# Patient Record
Sex: Male | Born: 1980 | Race: White | Hispanic: No | Marital: Married | Smoking: Former smoker
Health system: Southern US, Community
[De-identification: ages and names within clinical notes are randomized; demographics above are authoritative.]

## PROBLEM LIST (undated history)

## (undated) DIAGNOSIS — E785 Hyperlipidemia, unspecified: Secondary | ICD-10-CM

## (undated) DIAGNOSIS — I82409 Acute embolism and thrombosis of unspecified deep veins of unspecified lower extremity: Secondary | ICD-10-CM

## (undated) DIAGNOSIS — Z95818 Presence of other cardiac implants and grafts: Principal | ICD-10-CM

## (undated) DIAGNOSIS — I499 Cardiac arrhythmia, unspecified: Secondary | ICD-10-CM

## (undated) HISTORY — DX: Cardiac arrhythmia, unspecified: I49.9

## (undated) HISTORY — DX: Hyperlipidemia, unspecified: E78.5

## (undated) HISTORY — PX: LOOP RECORDER REMOVAL: EP1215

## (undated) HISTORY — PX: KNEE SURGERY: SHX244

## (undated) HISTORY — DX: Presence of other cardiac implants and grafts: Z95.818

## (undated) HISTORY — DX: Acute embolism and thrombosis of unspecified deep veins of unspecified lower extremity: I82.409

---

## 2010-05-12 HISTORY — PX: OTHER SURGICAL HISTORY: SHX169

## 2012-05-12 DIAGNOSIS — Z95818 Presence of other cardiac implants and grafts: Secondary | ICD-10-CM

## 2012-05-12 HISTORY — PX: OTHER SURGICAL HISTORY: SHX169

## 2012-05-12 HISTORY — DX: Presence of other cardiac implants and grafts: Z95.818

## 2016-06-13 ENCOUNTER — Encounter (INDEPENDENT_AMBULATORY_CARE_PROVIDER_SITE_OTHER): Payer: Self-pay

## 2016-06-13 ENCOUNTER — Ambulatory Visit (INDEPENDENT_AMBULATORY_CARE_PROVIDER_SITE_OTHER): Payer: Medicaid Other | Admitting: Family Medicine

## 2016-06-13 ENCOUNTER — Encounter: Payer: Self-pay | Admitting: Family Medicine

## 2016-06-13 VITALS — BP 103/74 | HR 72 | Temp 97.0°F | Ht 72.0 in | Wt 214.0 lb

## 2016-06-13 DIAGNOSIS — Z131 Encounter for screening for diabetes mellitus: Secondary | ICD-10-CM

## 2016-06-13 DIAGNOSIS — H9312 Tinnitus, left ear: Secondary | ICD-10-CM | POA: Diagnosis not present

## 2016-06-13 DIAGNOSIS — Z13220 Encounter for screening for lipoid disorders: Secondary | ICD-10-CM

## 2016-06-13 MED ORDER — FLUTICASONE PROPIONATE 50 MCG/ACT NA SUSP: 1.0000 | Freq: Two times a day (BID) | NASAL | 6 refills | Status: DC | PRN

## 2016-06-13 NOTE — Progress Notes (Signed)
BP 103/74   Pulse 72   Temp 97 F (36.1 C) (Oral)   Ht 6' (1.829 m)   Wt 214 lb (97.1 kg)   BMI 29.02 kg/m    Subjective:    Patient ID: Ryan Conner, male    DOB: Oct 21, 1980, 36 y.o.   MRN: [id number]  HPI: Ryan Conner is a 36 y.o. male presenting on 06/13/2016 for Tinnitus (Left ear)   HPI Left ear ringing Patient is coming in with complaints of left ear ringing is been going on for the past couple days. He does say that he had a viral, flulike illness over the past few days and had high fevers and cough and congestion and is still having some sinus pressure around his nose. The rain that he is having his left ear is been driving her crazy and he wants to know if there is anything that can be done about it. He denies any hearing loss that he knows of. He has not had this issue before. He denies any nausea or vomiting or dizziness.  Relevant past medical, surgical, family and social history reviewed and updated as indicated. Interim medical history since our last visit reviewed. Allergies and medications reviewed and updated.  Review of Systems  Constitutional: Negative for chills and fever.  HENT: Positive for congestion, sinus pressure and tinnitus. Negative for ear discharge, ear pain, postnasal drip, rhinorrhea, sneezing, sore throat and voice change.   Eyes: Negative for pain, discharge, redness and visual disturbance.  Respiratory: Negative for cough, shortness of breath and wheezing.   Cardiovascular: Negative for chest pain and leg swelling.  Musculoskeletal: Negative for gait problem.  Skin: Negative for rash.  Neurological: Negative for dizziness and light-headedness.  All other systems reviewed and are negative.   Per HPI unless specifically indicated above  Social History   Social History  . Marital status: Married    Spouse name: N/A  . Number of children: N/A  . Years of education: N/A   Occupational History  . Not on file.   Social History Main  Topics  . Smoking status: Current Every Day Smoker    Packs/day: 0.50    Years: 20.00  . Smokeless tobacco: Never Used  . Alcohol use Yes     Comment: occasional  . Drug use: No  . Sexual activity: Yes    Birth control/ protection: Surgical   Other Topics Concern  . Not on file   Social History Narrative  . No narrative on file    Past Surgical History:  Procedure Laterality Date  . CARDIAC SURGERY  2014   Monitor placed in heart due to arrythmias    Family History  Problem Relation Age of Onset  . Hypertension Mother   . Varicose Veins Mother   . Meniere's disease Mother   . Alcoholism Paternal Grandfather     Allergies as of 06/13/2016   No Known Allergies     Medication List       Accurate as of 06/13/16 11:22 AM. Always use your most recent med list.          fluticasone 50 MCG/ACT nasal spray Commonly known as:  FLONASE Place 1 spray into both nostrils 2 (two) times daily as needed for allergies or rhinitis.          Objective:    BP 103/74   Pulse 72   Temp 97 F (36.1 C) (Oral)   Ht 6' (1.829 m)   Wt 214 lb (97.1  kg)   BMI 29.02 kg/m   Wt Readings from Last 3 Encounters:  06/13/16 214 lb (97.1 kg)    Physical Exam  Constitutional: He is oriented to person, place, and time. He appears well-developed and well-nourished. No distress.  HENT:  Right Ear: Tympanic membrane, external ear and ear canal normal. Tympanic membrane is not perforated, not erythematous, not retracted and not bulging.  Left Ear: Tympanic membrane, external ear and ear canal normal. Tympanic membrane is not perforated, not erythematous, not retracted and not bulging.  Nose: No mucosal edema, rhinorrhea or sinus tenderness. No epistaxis. Right sinus exhibits no maxillary sinus tenderness and no frontal sinus tenderness. Left sinus exhibits no maxillary sinus tenderness and no frontal sinus tenderness.  Mouth/Throat: Uvula is midline and mucous membranes are normal. No  oropharyngeal exudate, posterior oropharyngeal edema, posterior oropharyngeal erythema or tonsillar abscesses.  Eyes: Conjunctivae and EOM are normal. Pupils are equal, round, and reactive to light. Right eye exhibits no discharge. Left eye exhibits no discharge. No scleral icterus.  Neck: Neck supple. No thyromegaly present.  Cardiovascular: Normal rate, regular rhythm, normal heart sounds and intact distal pulses.   No murmur heard. Pulmonary/Chest: Effort normal and breath sounds normal. No respiratory distress. He has no wheezes. He has no rales.  Musculoskeletal: Normal range of motion. He exhibits no edema.  Lymphadenopathy:    He has no cervical adenopathy.  Neurological: He is alert and oriented to person, place, and time. Coordination normal.  Skin: Skin is warm and dry. No rash noted. He is not diaphoretic.  Psychiatric: He has a normal mood and affect. His behavior is normal.  Nursing note and vitals reviewed.   No results found for this or any previous visit.    Assessment & Plan:   Problem List Items Addressed This Visit    None    Visit Diagnoses    Tinnitus aurium, left    -  Primary   Had a recent viral illness, recommend to use Flonase and Mucinex and nasal saline, return if does not improve or worsens   Diabetes mellitus screening       Relevant Orders   CMP14+EGFR (Completed)   Lipid screening       Relevant Orders   Lipid panel (Completed)       Follow up plan: Return if symptoms worsen or fail to improve, for Return for mole removal.  Caryl Pina, MD [facility] 06/13/2016, 11:22 AM

## 2016-06-14 LAB — CMP14+EGFR
A/G RATIO: 1.4 (ref 1.2–2.2)
ALBUMIN: 4.4 g/dL (ref 3.5–5.5)
ALT: 37 IU/L (ref 0–44)
AST: 23 IU/L (ref 0–40)
Alkaline Phosphatase: 118 IU/L — ABNORMAL HIGH (ref 39–117)
BUN / CREAT RATIO: 9 (ref 9–20)
BUN: 11 mg/dL (ref 6–20)
Bilirubin Total: 0.2 mg/dL (ref 0.0–1.2)
CALCIUM: 9.5 mg/dL (ref 8.7–10.2)
CO2: 25 mmol/L (ref 18–29)
Chloride: 98 mmol/L (ref 96–106)
Creatinine, Ser: 1.22 mg/dL (ref 0.76–1.27)
GFR, EST AFRICAN AMERICAN: 88 mL/min/{1.73_m2} (ref >59)
GFR, EST NON AFRICAN AMERICAN: 76 mL/min/{1.73_m2} (ref >59)
GLOBULIN, TOTAL: 3.2 g/dL (ref 1.5–4.5)
Glucose: 97 mg/dL (ref 65–99)
POTASSIUM: 4.6 mmol/L (ref 3.5–5.2)
SODIUM: 137 mmol/L (ref 134–144)
TOTAL PROTEIN: 7.6 g/dL (ref 6.0–8.5)

## 2016-06-14 LAB — LIPID PANEL
CHOL/HDL RATIO: 6.6 ratio — AB (ref 0.0–5.0)
Cholesterol, Total: 231 mg/dL — ABNORMAL HIGH (ref 100–199)
HDL: 35 mg/dL — ABNORMAL LOW (ref >39)
LDL Calculated: 124 mg/dL — ABNORMAL HIGH (ref 0–99)
Triglycerides: 359 mg/dL — ABNORMAL HIGH (ref 0–149)
VLDL Cholesterol Cal: 72 mg/dL — ABNORMAL HIGH (ref 5–40)

## 2016-06-25 ENCOUNTER — Telehealth: Payer: Self-pay | Admitting: Family Medicine

## 2016-06-26 NOTE — Telephone Encounter (Signed)
Pt notified of results Verbalizes understanding 

## 2016-07-01 ENCOUNTER — Other Ambulatory Visit: Payer: Self-pay

## 2016-07-01 MED ORDER — ATORVASTATIN CALCIUM 20 MG PO TABS: 20.0000 mg | ORAL_TABLET | Freq: Every day | ORAL | 0 refills | Status: DC

## 2016-07-07 ENCOUNTER — Encounter: Payer: Self-pay | Admitting: Family Medicine

## 2016-07-07 ENCOUNTER — Ambulatory Visit (INDEPENDENT_AMBULATORY_CARE_PROVIDER_SITE_OTHER): Payer: Medicaid Other | Admitting: Family Medicine

## 2016-07-07 VITALS — BP 116/75 | HR 69 | Temp 98.8°F | Ht 72.0 in | Wt 217.1 lb

## 2016-07-07 DIAGNOSIS — D229 Melanocytic nevi, unspecified: Secondary | ICD-10-CM

## 2016-07-07 DIAGNOSIS — I499 Cardiac arrhythmia, unspecified: Secondary | ICD-10-CM

## 2016-07-07 NOTE — Progress Notes (Signed)
BP 116/75   Pulse 69   Temp 98.8 F (37.1 C) (Oral)   Ht 6' (1.829 m)   Wt 217 lb 2 oz (98.5 kg)   BMI 29.45 kg/m    Subjective:    Patient ID: Ryan Conner, male    DOB: 23-Jun-1980, 36 y.o.   MRN: [id number]  HPI: Ryan Conner is a 36 y.o. male presenting on 07/07/2016 for Mole removal   HPI Large nevi that are increasing in size Patient has multiple large nevi that are increasing in size and somewhat changed in color. He has 2 very large and concerning on his back and one on his forehead and one on his anterior neck. There also in locations like his anterior neck constantly shaving and nicks it and It bleeds. He has never been diagnosed with any kind of skin cancers but with concerning about some of these because he gets so many large moles on his body.  Relevant past medical, surgical, family and social history reviewed and updated as indicated. Interim medical history since our last visit reviewed. Allergies and medications reviewed and updated.  Review of Systems  Constitutional: Negative for chills and fever.  Respiratory: Negative for shortness of breath and wheezing.   Cardiovascular: Negative for chest pain and leg swelling.  Musculoskeletal: Negative for back pain and gait problem.  Skin: Negative for color change and rash.  All other systems reviewed and are negative.   Per HPI unless specifically indicated above   Allergies as of 07/07/2016   No Known Allergies     Medication List       Accurate as of 07/07/16  2:11 PM. Always use your most recent med list.          atorvastatin 20 MG tablet Commonly known as:  LIPITOR Take 1 tablet (20 mg total) by mouth daily.   fluticasone 50 MCG/ACT nasal spray Commonly known as:  FLONASE Place 1 spray into both nostrils 2 (two) times daily as needed for allergies or rhinitis.          Objective:    BP 116/75   Pulse 69   Temp 98.8 F (37.1 C) (Oral)   Ht 6' (1.829 m)   Wt 217 lb 2 oz (98.5 kg)    BMI 29.45 kg/m   Wt Readings from Last 3 Encounters:  07/07/16 217 lb 2 oz (98.5 kg)  06/13/16 214 lb (97.1 kg)    Physical Exam  Constitutional: He is oriented to person, place, and time. He appears well-developed and well-nourished. No distress.  Eyes: Conjunctivae are normal. No scleral icterus.  Musculoskeletal: Normal range of motion. He exhibits no edema.  Neurological: He is alert and oriented to person, place, and time. Coordination normal.  Skin: Skin is warm and dry. Lesion (For large moles, one on his forehead that is half centimeters size and flesh-colored on the right side near his hairline. One on his anterior neck overlying his Adam's apple that is 0.25 cm in size and darkened pigmented and raised and pedunculated.) noted. No rash noted. He is not diaphoretic.  2 other nevi on his back in the thoracic region one just to left of his vertebrae in one just to the right of it that are half a centimeter in size and pedunculated and pigmented.  Psychiatric: He has a normal mood and affect. His behavior is normal.  Nursing note and vitals reviewed.   Skin lesion removal4: Betadine was used for preparation, 8 mL of 2% lidocaine  without epinephrine was used for anesthesia. Shave biopsy was performed of all 4 lesions, one on for head 1 on anterior neck, 2 on the back in the mid region. Electrocauterization was used on the neck and the forehead and silver nitrate was used on the back for hemostasis. Hemostasis was achieved and bleeding was minimal and patient tolerated procedure well     Assessment & Plan:   Problem List Items Addressed This Visit    None    Visit Diagnoses    Multiple atypical nevi    -  Primary   Relevant Orders   Pathology   Pathology   Pathology   Pathology   Cardiac arrhythmia, unspecified cardiac arrhythmia type       needs removal of internal monitor after ablation   Relevant Orders   Ambulatory referral to Cardiology     Patient has a history of a  cardiac ablation for an arrhythmia and has not had any issues long-term, this was at a previous facility and he does not have a cardiologist here and he would like to have the monitor removed because it is nonfunctional at this point.   Follow up plan: Return if symptoms worsen or fail to improve.  Counseling provided for all of the vaccine components Orders Placed This Encounter  Procedures  . Ambulatory referral to Cardiology    Caryl Pina, MD [facility] 07/07/2016, 2:11 PM

## 2016-07-10 LAB — PATHOLOGY

## 2016-07-31 ENCOUNTER — Encounter: Payer: Self-pay | Admitting: Cardiology

## 2016-07-31 NOTE — Progress Notes (Signed)
Cardiology Office Note  Date: 08/04/2016   ID: Ryan Conner, DOB 29-Mar-1981, MRN [id number]  PCP: Worthy Rancher, MD  Consulting Cardiologist: Rozann Lesches, MD   Chief Complaint  Patient presents with  . History of SVT  . Loop recorder in place    History of Present Illness: Ryan Conner is a 36 y.o. male referred for cardiology consultation by Dr. Warrick Parisian. Very limited information is available. We are requesting outside records that may have been received by [facility]. Patient describes what sounds like a history of symptomatic SVT associated with syncope back in 2014. He underwent radiofrequency ablation in [place] and also had an implantable loop recorder placed for further monitoring. He brought in device cards today. He has a Medtronic [employer], implanted 12/03/2012, serial number [id number] model number [id number]. He has not attempted a remote interrogation in quite some time and likely the device is no longer functioning.  He does not report any symptoms of significant palpitations or syncope, no exertional chest pain or unusual shortness of breath. He does describe a vague intermittent sharp soreness around where the device was implanted. He would like to have it removed.  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  Past Medical History:  Diagnosis Date  . Hyperlipidemia   . Status post placement of implantable loop recorder 2014   Medtronic Reveal LINQ - implanted in [place]    Past Surgical History:  Procedure Laterality Date  . Implantable loop recorder  2014    Current Outpatient Prescriptions  Medication Sig Dispense Refill  . atorvastatin (LIPITOR) 20 MG tablet Take 1 tablet (20 mg total) by mouth daily. 90 tablet 0   No current facility-administered medications for this visit.    Allergies:  Patient has no known allergies.   Social History: The patient  reports that he has been smoking.  He  started smoking about 24 years ago. He has a 10.00 pack-year smoking history. He has never used smokeless tobacco. He reports that he drinks alcohol. He reports that he does not use drugs.   Family History: The patient's family history includes Alcoholism in his paternal grandfather; Hypertension in his mother; Meniere's disease in his mother; Varicose Veins in his mother.   ROS:  Please see the history of present illness. Otherwise, complete review of systems is positive for none.  All other systems are reviewed and negative.   Physical Exam: VS:  BP 124/86   Pulse 77   Wt 219 lb (99.3 kg)   SpO2 97%   BMI 29.70 kg/m , BMI Body mass index is 29.7 kg/m.  Wt Readings from Last 3 Encounters:  08/04/16 219 lb (99.3 kg)  07/07/16 217 lb 2 oz (98.5 kg)  06/13/16 214 lb (97.1 kg)    General: Patient appears comfortable at rest. HEENT: Conjunctiva and lids normal, oropharynx clear with moist mucosa. Neck: Supple, no elevated JVP or carotid bruits, no thyromegaly. Lungs: Clear to auscultation, nonlabored breathing at rest. Cardiac: Regular rate and rhythm, no S3 or significant systolic murmur, no pericardial rub. Thorax: Palpable small area of firmness anteriorly to the left of the sternum consistent with reported implantable loop recorder. Abdomen: Soft, nontender, bowel sounds present. Extremities: No pitting edema, distal pulses 2+. Skin: Warm and dry. Musculoskeletal: No kyphosis. Neuropsychiatric: Alert and oriented x3, affect grossly appropriate.  ECG: No old tracing available for comparison.  Recent Labwork: 06/13/2016: ALT 37; AST 23; BUN 11; Creatinine, Ser  1.22; Potassium 4.6; Sodium 137     Component Value Date/Time   CHOL 231 (H) 06/13/2016 1124   TRIG 359 (H) 06/13/2016 1124   HDL 35 (L) 06/13/2016 1124   CHOLHDL 6.6 (H) 06/13/2016 1124   LDLCALC 124 (H) 06/13/2016 1124   Assessment and Plan:  1. Patient with Medtronic Reveal LINQ implantable loop recorder in place,  implanted back in 2014 in [place]. Suspect that device is no longer functioning and he has not used it for follow-up interrogation of prior SVT ablation in a few years. He would like to have it removed. Referral being made to EP for further discussion.  2. History of what sounds like symptomatic SVT ablation in 2014 in [place]. Requesting outside records from [facility]. He is no longer symptomatic and is not on any specific medications for rhythm management. ECG reviewed.  3. Elevated lipids, now on Lipitor. Recent cholesterol 231 and LDL 124. Follows with PCP.  4. Tobacco abuse. Continue to work on efforts at smoking cessation with PCP.  Current medicines were reviewed with the patient today.   Orders Placed This Encounter  Procedures  . Ambulatory referral to Cardiac Electrophysiology  . EKG 12-Lead    Disposition: Referral to EP, otherwise follow-up as needed.  Signed, Satira Sark, MD, [facility] 08/04/2016 8:48 AM    [facility]. [address], [place], [place] [address] Phone: [phone]; Fax: [phone]

## 2016-08-04 ENCOUNTER — Ambulatory Visit (INDEPENDENT_AMBULATORY_CARE_PROVIDER_SITE_OTHER): Payer: Medicaid Other | Admitting: Cardiology

## 2016-08-04 ENCOUNTER — Encounter: Payer: Self-pay | Admitting: Cardiology

## 2016-08-04 VITALS — BP 124/86 | HR 77 | Wt 219.0 lb

## 2016-08-04 DIAGNOSIS — I471 Supraventricular tachycardia: Secondary | ICD-10-CM | POA: Diagnosis not present

## 2016-08-04 DIAGNOSIS — E782 Mixed hyperlipidemia: Secondary | ICD-10-CM | POA: Diagnosis not present

## 2016-08-04 DIAGNOSIS — Z95818 Presence of other cardiac implants and grafts: Secondary | ICD-10-CM

## 2016-08-04 DIAGNOSIS — Z720 Tobacco use: Secondary | ICD-10-CM | POA: Diagnosis not present

## 2016-08-04 NOTE — Patient Instructions (Signed)
Your physician recommends that you schedule a follow-up appointment in: to be determined    You have been referred to our Electrophysiologist, Dr. Lovena Le, to discuss removal of loop recorder. You will receive a call from our [place] office, [phone]       Thank you for choosing [facility] !

## 2016-10-09 ENCOUNTER — Ambulatory Visit (INDEPENDENT_AMBULATORY_CARE_PROVIDER_SITE_OTHER): Payer: 59 | Admitting: Family Medicine

## 2016-10-09 ENCOUNTER — Encounter: Payer: Self-pay | Admitting: Family Medicine

## 2016-10-09 VITALS — BP 107/79 | HR 78 | Temp 97.8°F | Ht 72.0 in | Wt 217.0 lb

## 2016-10-09 DIAGNOSIS — M545 Low back pain, unspecified: Secondary | ICD-10-CM

## 2016-10-09 MED ORDER — PREDNISONE 20 MG PO TABS: ORAL_TABLET | ORAL | 0 refills | Status: DC

## 2016-10-09 MED ORDER — ATORVASTATIN CALCIUM 20 MG PO TABS: 20.0000 mg | ORAL_TABLET | Freq: Every day | ORAL | 3 refills | Status: DC

## 2016-10-09 NOTE — Progress Notes (Signed)
   BP 107/79   Pulse 78   Temp 97.8 F (36.6 C) (Oral)   Ht 6' (1.829 m)   Wt 217 lb (98.4 kg)   BMI 29.43 kg/m    Subjective:    Patient ID: Ryan Conner, male    DOB: Sep 20, 1980, 36 y.o.   MRN: [id number]  HPI: Ryan Conner is a 36 y.o. male presenting on 10/09/2016 for Back Pain (would like referral to have physical therapy, has seen chiropractor for last 3 days)   HPI Left lower back pain Patient has had increased left lower back pain over the past 3 days. He cannot recall any specific incident or trauma. He denies any major lifting. His back pain is located on the left lower aspect near his buttocks but sometimes radiates up his back along that side. He denies any numbness or weakness or pain going down either of his legs. He denies any fevers or chills or redness or warmth. He is able to ambulate normally but when he bends forward to try and pick something up is when he feels the pain the most.  Relevant past medical, surgical, family and social history reviewed and updated as indicated. Interim medical history since our last visit reviewed. Allergies and medications reviewed and updated.  Review of Systems  Constitutional: Negative for chills and fever.  Respiratory: Negative for shortness of breath and wheezing.   Cardiovascular: Negative for chest pain and leg swelling.  Genitourinary: Negative for decreased urine volume, dysuria and urgency.  Musculoskeletal: Positive for back pain. Negative for gait problem.  Skin: Negative for rash.  All other systems reviewed and are negative.   Per HPI unless specifically indicated above        Objective:    BP 107/79   Pulse 78   Temp 97.8 F (36.6 C) (Oral)   Ht 6' (1.829 m)   Wt 217 lb (98.4 kg)   BMI 29.43 kg/m   Wt Readings from Last 3 Encounters:  10/09/16 217 lb (98.4 kg)  08/04/16 219 lb (99.3 kg)  07/07/16 217 lb 2 oz (98.5 kg)    Physical Exam  Constitutional: He is oriented to person, place, and time.  He appears well-developed and well-nourished. No distress.  Eyes: Conjunctivae are normal. No scleral icterus.  Musculoskeletal: Normal range of motion. He exhibits tenderness. He exhibits no edema.       Back:  Neurological: He is alert and oriented to person, place, and time. Coordination normal.  Skin: Skin is warm and dry. No rash noted. He is not diaphoretic.  Psychiatric: He has a normal mood and affect. His behavior is normal.  Nursing note and vitals reviewed.     Assessment & Plan:   Problem List Items Addressed This Visit    None    Visit Diagnoses    Acute right-sided low back pain without sciatica    -  Primary   Relevant Medications   predniSONE (DELTASONE) 20 MG tablet   Other Relevant Orders   Ambulatory referral to Physical Therapy      Follow up plan: Return if symptoms worsen or fail to improve.  Counseling provided for all of the vaccine components Orders Placed This Encounter  Procedures  . Ambulatory referral to Physical Therapy    Caryl Pina, MD [facility] 10/09/2016, 1:17 PM

## 2016-10-21 ENCOUNTER — Ambulatory Visit: Payer: Managed Care, Other (non HMO) | Attending: Family Medicine | Admitting: Physical Therapy

## 2017-04-14 ENCOUNTER — Encounter: Payer: Self-pay | Admitting: Family Medicine

## 2017-04-14 ENCOUNTER — Ambulatory Visit (INDEPENDENT_AMBULATORY_CARE_PROVIDER_SITE_OTHER): Payer: 59 | Admitting: Family Medicine

## 2017-04-14 VITALS — BP 125/76 | HR 77 | Temp 97.4°F | Ht 72.0 in | Wt 222.0 lb

## 2017-04-14 DIAGNOSIS — R0789 Other chest pain: Secondary | ICD-10-CM | POA: Diagnosis not present

## 2017-04-14 DIAGNOSIS — M940 Chondrocostal junction syndrome [Tietze]: Secondary | ICD-10-CM

## 2017-04-14 NOTE — Progress Notes (Signed)
   BP 125/76   Pulse 77   Temp (!) 97.4 F (36.3 C) (Oral)   Ht 6' (1.829 m)   Wt 222 lb (100.7 kg)   BMI 30.11 kg/m    Subjective:    Patient ID: Ryan Conner, male    DOB: 1981-02-12, 36 y.o.   MRN: [id number]  HPI: Ryan Conner is a 36 y.o. male presenting on 04/14/2017 for Abdominal Pain (LUQ pain x 1 month, sporadic episodes daily)   HPI Chest wall pain Patient is coming in complaining of chest wall pain on the left side that started up when he has been lifting weights more often over the past few days.  He says is just been hurting since yesterday hurts more with movement of his left arm and chest.  He denies any palpitations or difficulty breathing or swelling in his legs.  He denies any shortness of breath or wheezing.  Relevant past medical, surgical, family and social history reviewed and updated as indicated. Interim medical history since our last visit reviewed. Allergies and medications reviewed and updated.  Review of Systems  Constitutional: Negative for chills and fever.  Eyes: Negative for discharge.  Respiratory: Negative for shortness of breath and wheezing.   Cardiovascular: Negative for chest pain and leg swelling.  Musculoskeletal: Positive for myalgias. Negative for back pain and gait problem.  Skin: Negative for rash.  All other systems reviewed and are negative.   Per HPI unless specifically indicated above     Objective:    BP 125/76   Pulse 77   Temp (!) 97.4 F (36.3 C) (Oral)   Ht 6' (1.829 m)   Wt 222 lb (100.7 kg)   BMI 30.11 kg/m   Wt Readings from Last 3 Encounters:  04/14/17 222 lb (100.7 kg)  10/09/16 217 lb (98.4 kg)  08/04/16 219 lb (99.3 kg)    Physical Exam  Constitutional: He is oriented to person, place, and time. He appears well-developed and well-nourished. No distress.  Eyes: Conjunctivae are normal. No scleral icterus.  Cardiovascular: Normal rate, regular rhythm, normal heart sounds and intact distal pulses.  No  murmur heard. Pulmonary/Chest: Effort normal and breath sounds normal. No respiratory distress. He has no wheezes.  Musculoskeletal: Normal range of motion. He exhibits tenderness (Left anterior chest wall tenderness extending to mid axillary line over pectoralis muscle). He exhibits no edema.  Neurological: He is alert and oriented to person, place, and time. Coordination normal.  Skin: Skin is warm and dry. No rash noted. He is not diaphoretic.  Psychiatric: He has a normal mood and affect. His behavior is normal.  Nursing note and vitals reviewed.     Assessment & Plan:   Problem List Items Addressed This Visit    None    Visit Diagnoses    Costochondritis, acute    -  Primary   ibuprofen 600 mg.    Relevant Orders   DG Chest 2 View   Chest wall pain       Relevant Orders   DG Chest 2 View       Follow up plan: Return if symptoms worsen or fail to improve.  Counseling provided for all of the vaccine components Orders Placed This Encounter  Procedures  . DG Chest 2 View    Caryl Pina, MD [facility] 04/14/2017, 10:21 AM

## 2018-06-17 ENCOUNTER — Encounter: Payer: Self-pay | Admitting: Family Medicine

## 2018-06-17 ENCOUNTER — Ambulatory Visit (INDEPENDENT_AMBULATORY_CARE_PROVIDER_SITE_OTHER): Payer: 59 | Admitting: Family Medicine

## 2018-06-17 VITALS — BP 113/80 | HR 72 | Temp 97.3°F | Ht 72.0 in | Wt 223.0 lb

## 2018-06-17 DIAGNOSIS — R197 Diarrhea, unspecified: Secondary | ICD-10-CM | POA: Diagnosis not present

## 2018-06-17 DIAGNOSIS — A084 Viral intestinal infection, unspecified: Secondary | ICD-10-CM | POA: Diagnosis not present

## 2018-06-17 DIAGNOSIS — R6889 Other general symptoms and signs: Secondary | ICD-10-CM

## 2018-06-17 LAB — VERITOR FLU A/B WAIVED
Influenza A: NEGATIVE
Influenza B: NEGATIVE

## 2018-06-17 MED ORDER — ONDANSETRON 4 MG PO TBDP: 4.0000 mg | ORAL_TABLET | Freq: Three times a day (TID) | ORAL | 0 refills | Status: DC | PRN

## 2018-06-17 NOTE — Patient Instructions (Signed)
I have sent you in Zofran to use every 8 hours if needed for nausea and vomiting.  We discussed that you should make sure that you are getting plenty of fluids.  Eat a bland diet if you have an appetite.  If symptoms get worse or do not improve, return for reevaluation.  If you are unable to keep fluids down you need to go straight to the emergency department for evaluation.   Viral Gastroenteritis, Adult  Viral gastroenteritis is also known as the stomach flu. This condition is caused by certain germs (viruses). These germs can be passed from person to person very easily (are very contagious). This condition can cause sudden watery poop (diarrhea), fever, and throwing up (vomiting). Having watery poop and throwing up can make you feel weak and cause you to get dehydrated. Dehydration can make you tired and thirsty, make you have a dry mouth, and make it so you pee (urinate) less often. Older adults and people with other diseases or a weak defense system (immune system) are at higher risk for dehydration. It is important to replace the fluids that you lose from having watery poop and throwing up. Follow these instructions at home: Follow instructions from your doctor about how to care for yourself at home. Eating and drinking Follow these instructions as told by your doctor:  Take an oral rehydration solution (ORS). This is a drink that is sold at pharmacies and stores.  Drink clear fluids in small amounts as you are able, such as: ? Water. ? Ice chips. ? Diluted fruit juice. ? Low-calorie sports drinks.  Eat bland, easy-to-digest foods in small amounts as you are able, such as: ? Bananas. ? Applesauce. ? Rice. ? Low-fat (lean) meats. ? Toast. ? Crackers.  Avoid fluids that have a lot of sugar or caffeine in them.  Avoid alcohol.  Avoid spicy or fatty foods. General instructions   Drink enough fluid to keep your pee (urine) clear or pale yellow.  Wash your hands often. If you  cannot use soap and water, use hand sanitizer.  Make sure that all people in your home wash their hands well and often.  Rest at home while you get better.  Take over-the-counter and prescription medicines only as told by your doctor.  Watch your condition for any changes.  Take a warm bath to help with any burning or pain from having watery poop.  Keep all follow-up visits as told by your doctor. This is important. Contact a doctor if:  You cannot keep fluids down.  Your symptoms get worse.  You have new symptoms.  You feel light-headed or dizzy.  You have muscle cramps. Get help right away if:  You have chest pain.  You feel very weak or you pass out (faint).  You see blood in your throw-up.  Your throw-up looks like coffee grounds.  You have bloody or black poop (stools) or poop that look like tar.  You have a very bad headache, a stiff neck, or both.  You have a rash.  You have very bad pain, cramping, or bloating in your belly (abdomen).  You have trouble breathing.  You are breathing very quickly.  Your heart is beating very quickly.  Your skin feels cold and clammy.  You feel confused.  You have pain when you pee.  You have signs of dehydration, such as: ? Dark pee, hardly any pee, or no pee. ? Cracked lips. ? Dry mouth. ? Sunken eyes. ? Sleepiness. ?  Weakness. This information is not intended to replace advice given to you by your health care provider. Make sure you discuss any questions you have with your health care provider. Document Released: 10/15/2007 Document Revised: 01/20/2018 Document Reviewed: 01/02/2015 Elsevier Interactive Patient Education  2019 [employer].

## 2018-06-17 NOTE — Progress Notes (Signed)
Subjective: CC: Diarrhea PCP: Dettinger, Fransisca Kaufmann, MD PFX:[id number] Payette is a 38 y.o. male presenting to clinic today for:  1. Diarrhea Patient reports onset of nausea, vomiting and diarrhea Friday.  He notes that he now feels myalgia and some weakness associated with the illness.  He has not measured any fevers.  He denies any hematochezia, melena or hematemesis.  He has had a mild nonproductive cough with associated headache.  Denies any abdominal pain.  Denies any consumption of undercooked foods, foods left out too long or untreated water.  He has been using Tylenol and emergen-c with little improvement in symptoms.  He is able to keep fluids down without difficulty and continues to drink coffee and smoke cigarettes.  He has missed 1.5 days from work and will need a paper filled out at some point.   ROS: Per HPI  No Known Allergies Past Medical History:  Diagnosis Date  . Hyperlipidemia   . Status post placement of implantable loop recorder 2014   Medtronic Reveal LINQ - implanted in [place]    Current Outpatient Medications:  .  atorvastatin (LIPITOR) 20 MG tablet, Take 1 tablet (20 mg total) by mouth daily. (Patient not taking: Reported on 04/14/2017), Disp: 90 tablet, Rfl: 3 Social History   Socioeconomic History  . Marital status: Married    Spouse name: Not on file  . Number of children: Not on file  . Years of education: Not on file  . Highest education level: Not on file  Occupational History  . Not on file  Social Needs  . Financial resource strain: Not on file  . Food insecurity:    Worry: Not on file    Inability: Not on file  . Transportation needs:    Medical: Not on file    Non-medical: Not on file  Tobacco Use  . Smoking status: Current Every Day Smoker    Packs/day: 0.50    Years: 20.00    Pack years: 10.00    Start date: 08/04/1992  . Smokeless tobacco: Never Used  Substance and Sexual Activity  . Alcohol use: Yes    Comment: occasional  . Drug  use: No  . Sexual activity: Yes    Birth control/protection: Surgical  Lifestyle  . Physical activity:    Days per week: Not on file    Minutes per session: Not on file  . Stress: Not on file  Relationships  . Social connections:    Talks on phone: Not on file    Gets together: Not on file    Attends religious service: Not on file    Active member of club or organization: Not on file    Attends meetings of clubs or organizations: Not on file    Relationship status: Not on file  . Intimate partner violence:    Fear of current or ex partner: Not on file    Emotionally abused: Not on file    Physically abused: Not on file    Forced sexual activity: Not on file  Other Topics Concern  . Not on file  Social History Narrative  . Not on file   Family History  Problem Relation Age of Onset  . Hypertension Mother   . Varicose Veins Mother   . Meniere's disease Mother   . Alcoholism Paternal Grandfather     Objective: Office vital signs reviewed. BP 113/80   Pulse 72   Temp (!) 97.3 F (36.3 C) (Oral)   Ht 6' (1.829 m)  Wt 223 lb (101.2 kg)   BMI 30.24 kg/m   Physical Examination:  General: Awake, alert, well nourished, No acute distress HEENT: Normal    Eyes: PERRLA, extraocular membranes intact, sclera white.    Nose: nasal turbinates moist, no nasal discharge    Throat: moist mucus membranes, no erythema Cardio: regular rate and rhythm, S1S2 heard, no murmurs appreciated Pulm: clear to auscultation bilaterally, no wheezes, rhonchi or rales; normal work of breathing on room air GI: soft, mild epigastric TTP, non-distended, bowel sounds present x4, no hepatomegaly, no splenomegaly, no masses  Assessment/ Plan: 38 y.o. male   1. Viral gastroenteritis Patient is afebrile nontoxic-appearing.  No evidence of dehydration.  Abdominal exam was remarkable for only mild epigastric tenderness palpation.  No peritoneal signs.  Nothing to suggest acute appendicitis,  cholecystitis or pancreatitis.  This is likely a viral gastroenteritis.  I have recommended pushing clear fluids, bland diet and have provided Zofran to use if needed for nausea.  We discussed reasons for reevaluation emergent valuation emergency department.  Patient was good understanding.  He will return his forms once he has them.  2. Flu-like symptoms Rapid flu was negative - Veritor Flu A/B Waived  3. Diarrhea, unspecified type As above - Basic Metabolic Panel   Orders Placed This Encounter  Procedures  . Basic Metabolic Panel  . Veritor Flu A/B Waived    Order Specific Question:   Source    Answer:   nasal   Meds ordered this encounter  Medications  . ondansetron (ZOFRAN ODT) 4 MG disintegrating tablet    Sig: Take 1 tablet (4 mg total) by mouth every 8 (eight) hours as needed for nausea or vomiting.    Dispense:  20 tablet    Refill:  Frazer, DO [place] [phone]

## 2018-06-18 LAB — BASIC METABOLIC PANEL
BUN/Creatinine Ratio: 20 (ref 9–20)
BUN: 20 mg/dL (ref 6–20)
CO2: 20 mmol/L (ref 20–29)
CREATININE: 1 mg/dL (ref 0.76–1.27)
Calcium: 9.1 mg/dL (ref 8.7–10.2)
Chloride: 104 mmol/L (ref 96–106)
GFR calc non Af Amer: 96 mL/min/{1.73_m2} (ref >59)
GFR, EST AFRICAN AMERICAN: 111 mL/min/{1.73_m2} (ref >59)
GLUCOSE: 89 mg/dL (ref 65–99)
Potassium: 4.2 mmol/L (ref 3.5–5.2)
SODIUM: 139 mmol/L (ref 134–144)

## 2018-06-30 DIAGNOSIS — Z0289 Encounter for other administrative examinations: Secondary | ICD-10-CM

## 2018-08-12 ENCOUNTER — Telehealth: Payer: Self-pay | Admitting: Family Medicine

## 2018-08-12 NOTE — Telephone Encounter (Signed)
Pt states that sister in law who lives in the home with him is suspected to have the coronavirus, states she has a visit with one of our providers this morning and his work is requiring him to be tested and knows that we aren't testing but wants to know what needs to be done

## 2018-08-12 NOTE — Telephone Encounter (Signed)
Spoke with patient and said that unless she gets tested he is going to have to quarantine for 2 weeks, we are not testing currently but [facility] is and he may try and call over there to see if she can get tested because he needs to get back to work.  If he can get her to somewhere where she can be tested and it comes back negative but he can go back to work as soon as the test is negative

## 2018-08-12 NOTE — Telephone Encounter (Signed)
Please review and advise.

## 2018-11-30 DIAGNOSIS — Z1159 Encounter for screening for other viral diseases: Secondary | ICD-10-CM | POA: Diagnosis not present

## 2019-01-23 ENCOUNTER — Emergency Department (HOSPITAL_COMMUNITY): Payer: BC Managed Care – PPO

## 2019-01-23 ENCOUNTER — Emergency Department (HOSPITAL_COMMUNITY)
Admission: EM | Admit: 2019-01-23 | Discharge: 2019-01-23 | Disposition: A | Discharge status: Home / Self Care | Payer: BC Managed Care – PPO | Attending: Emergency Medicine | Admitting: Emergency Medicine

## 2019-01-23 ENCOUNTER — Other Ambulatory Visit: Payer: Self-pay

## 2019-01-23 ENCOUNTER — Encounter (HOSPITAL_COMMUNITY): Payer: Self-pay

## 2019-01-23 DIAGNOSIS — R319 Hematuria, unspecified: Secondary | ICD-10-CM | POA: Insufficient documentation

## 2019-01-23 DIAGNOSIS — F17200 Nicotine dependence, unspecified, uncomplicated: Secondary | ICD-10-CM | POA: Insufficient documentation

## 2019-01-23 DIAGNOSIS — R109 Unspecified abdominal pain: Secondary | ICD-10-CM | POA: Diagnosis not present

## 2019-01-23 LAB — URINALYSIS, ROUTINE W REFLEX MICROSCOPIC
Bilirubin Urine: NEGATIVE
Glucose, UA: NEGATIVE mg/dL
Ketones, ur: NEGATIVE mg/dL
Leukocytes,Ua: NEGATIVE
Nitrite: NEGATIVE
Protein, ur: NEGATIVE mg/dL
Specific Gravity, Urine: 1.019 (ref 1.005–1.030)
pH: 5 (ref 5.0–8.0)

## 2019-01-23 NOTE — Discharge Instructions (Addendum)
Follow-up with your primary care provider. Return to the ER as needed.

## 2019-01-23 NOTE — ED Notes (Signed)
Patient verbalizes understanding of discharge instructions. Opportunity for questioning and answers were provided. Armband removed by staff, pt discharged from ED.  

## 2019-01-23 NOTE — ED Notes (Signed)
Patient transported to X-ray 

## 2019-01-23 NOTE — ED Provider Notes (Signed)
[facility] EMERGENCY DEPARTMENT Provider Note   CSN: [id number] Arrival date & time: 01/23/19  1007     History   Chief Complaint Chief Complaint  Patient presents with  . Hematuria    HPI Jory Ancelet is a 38 y.o. male with past medical history of nephrolithiasis, presenting to the emergency department with complaint of hematuria that began this morning.  He states he asked some small blood clots in his urine this morning with some associated dysuria.  He has been having some left low back pain intermittently over the last month.  He also states late last night he had an episode of emesis though denies any persistent nausea.  Denies associated fever, abdominal pain, urinary frequency, testicular pain or swelling, penile discharge.  He states his symptoms do not feel very consistent to his history of nephrolithiasis, he usually has his pain in his abdomen with persistent nausea vomiting.  He states when he urinated here in the ED to provide urine sample, his urine appeared normal.     The history is provided by the patient.    Past Medical History:  Diagnosis Date  . Hyperlipidemia   . Status post placement of implantable loop recorder 2014   Medtronic Reveal LINQ - implanted in [place]    There are no active problems to display for this patient.   Past Surgical History:  Procedure Laterality Date  . Implantable loop recorder  2014        Home Medications    Prior to Admission medications   Medication Sig Start Date End Date Taking? Authorizing Provider  ondansetron (ZOFRAN ODT) 4 MG disintegrating tablet Take 1 tablet (4 mg total) by mouth every 8 (eight) hours as needed for nausea or vomiting. 06/17/18   Janora Norlander, DO    Family History Family History  Problem Relation Age of Onset  . Hypertension Mother   . Varicose Veins Mother   . Meniere's disease Mother   . Alcoholism Paternal Grandfather     Social History Social History   Tobacco Use  . Smoking status: Current Every Day Smoker    Packs/day: 0.50    Years: 20.00    Pack years: 10.00    Start date: 08/04/1992  . Smokeless tobacco: Never Used  Substance Use Topics  . Alcohol use: Yes    Comment: occasional  . Drug use: No     Allergies   Patient has no known allergies.   Review of Systems Review of Systems  Constitutional: Negative for fever.  Gastrointestinal: Positive for vomiting. Negative for abdominal pain and nausea.  Genitourinary: Positive for dysuria and hematuria. Negative for discharge, frequency, penile pain and testicular pain.  Musculoskeletal: Positive for back pain.  All other systems reviewed and are negative.    Physical Exam Updated Vital Signs BP 126/83 (BP Location: Left Arm)   Pulse 78   Temp 98.2 F (36.8 C) (Oral)   Resp 16   SpO2 100%   Physical Exam Vitals signs and nursing note reviewed.  Constitutional:      General: He is not in acute distress.    Appearance: He is well-developed. He is not ill-appearing.  HENT:     Head: Normocephalic and atraumatic.  Eyes:     Conjunctiva/sclera: Conjunctivae normal.  Cardiovascular:     Rate and Rhythm: Normal rate and regular rhythm.  Pulmonary:     Effort: Pulmonary effort is normal. No respiratory distress.     Breath sounds: Normal  breath sounds.  Abdominal:     General: Bowel sounds are normal.     Palpations: Abdomen is soft.     Tenderness: There is no abdominal tenderness. There is no right CVA tenderness, left CVA tenderness, guarding or rebound.  Skin:    General: Skin is warm.  Neurological:     Mental Status: He is alert.  Psychiatric:        Behavior: Behavior normal.      ED Treatments / Results  Labs (all labs ordered are listed, but only abnormal results are displayed) Labs Reviewed  URINALYSIS, ROUTINE W REFLEX MICROSCOPIC - Abnormal; Notable for the following components:      Result Value   APPearance HAZY (*)    Hgb urine dipstick  LARGE (*)    Bacteria, UA RARE (*)    All other components within normal limits  URINE CULTURE    EKG None  Radiology Ct Renal Stone Study  Result Date: 01/23/2019 CLINICAL DATA:  Hematuria. Flank pain. History of kidney stones. EXAM: CT ABDOMEN AND PELVIS WITHOUT CONTRAST TECHNIQUE: Multidetector CT imaging of the abdomen and pelvis was performed following the standard protocol without IV contrast. COMPARISON:  None. FINDINGS: Lower chest: Normal. Hepatobiliary: No focal liver abnormality is seen. No gallstones, gallbladder wall thickening, or biliary dilatation. Pancreas: Unremarkable. No pancreatic ductal dilatation or surrounding inflammatory changes. Spleen: Normal in size without focal abnormality. Adrenals/Urinary Tract: Adrenal glands are unremarkable. Kidneys are normal, without renal calculi, focal lesion, or hydronephrosis. Bladder is unremarkable. Stomach/Bowel: Stomach is within normal limits. Appendix appears normal. No evidence of bowel wall thickening, distention, or inflammatory changes. Vascular/Lymphatic: No significant vascular findings are present. No enlarged abdominal or pelvic lymph nodes. Reproductive: Prostate is unremarkable. Other: No abdominal wall hernia or abnormality. No abdominopelvic ascites. Musculoskeletal: No acute or significant osseous findings. IMPRESSION: Benign-appearing abdomen and pelvis. Electronically Signed   By: Lorriane Shire M.D.   On: 01/23/2019 12:18    Procedures Procedures (including critical care time)  Medications Ordered in ED Medications - No data to display   Initial Impression / Assessment and Plan / ED Course  I have reviewed the triage vital signs and the nursing notes.  Pertinent labs & imaging results that were available during my care of the patient were reviewed by me and considered in my medical decision making (see chart for details).        Patient with history of nephrolithiasis, presenting with hematuria this  morning and dysuria.  He has been having some intermittent left lower back pain for about a month now.  He is asymptomatic upon arrival to the ED and his gross hematuria had resolved.  His UA does show large hemoglobin though rare bacteria and no signs of infection.  Culture sent.  Had shared decision making with patient regarding imaging for kidney stone.  Discussed possibility of recently passed stone.  Patient would prefer to have scan done here in the ED.  CT scan is negative.  Discussed results and recommendation for PCP follow-up.  He is aware he is a urine culture pending and is aware of return precautions.  Patient is well-appearing, no distress, agreeable to plan and safe discharge.  Discussed results, findings, treatment and follow up. Patient advised of return precautions. Patient verbalized understanding and agreed with plan.   Final Clinical Impressions(s) / ED Diagnoses   Final diagnoses:  Hematuria, unspecified type    ED Discharge Orders    None       Quentin Cornwall,  [place] N, PA-C 01/23/19 1227    Malvin Johns, MD 01/23/19 1335

## 2019-01-23 NOTE — ED Notes (Signed)
Patient transported to CT 

## 2019-01-23 NOTE — ED Triage Notes (Signed)
Patient complains of hematuria since am. Reports that he had lower back pain intermittent x 1 month. Denies pain on assessment. Denies trauma

## 2019-01-23 NOTE — ED Notes (Signed)
Pt returns from ct scan. 

## 2019-01-24 LAB — URINE CULTURE: Culture: <10000 — AB

## 2019-06-29 ENCOUNTER — Other Ambulatory Visit: Payer: Self-pay

## 2019-06-29 ENCOUNTER — Ambulatory Visit: Payer: BC Managed Care – PPO | Attending: Internal Medicine

## 2019-06-29 DIAGNOSIS — Z20822 Contact with and (suspected) exposure to covid-19: Secondary | ICD-10-CM

## 2019-07-01 LAB — NOVEL CORONAVIRUS, NAA: SARS-CoV-2, NAA: NOT DETECTED

## 2020-01-12 DIAGNOSIS — R05 Cough: Secondary | ICD-10-CM | POA: Diagnosis not present

## 2020-01-12 DIAGNOSIS — U071 COVID-19: Secondary | ICD-10-CM | POA: Diagnosis not present

## 2020-01-12 DIAGNOSIS — R0981 Nasal congestion: Secondary | ICD-10-CM | POA: Diagnosis not present

## 2020-01-12 DIAGNOSIS — R509 Fever, unspecified: Secondary | ICD-10-CM | POA: Diagnosis not present

## 2020-03-26 ENCOUNTER — Emergency Department (HOSPITAL_COMMUNITY): Payer: BC Managed Care – PPO

## 2020-03-26 ENCOUNTER — Other Ambulatory Visit: Payer: Self-pay

## 2020-03-26 ENCOUNTER — Encounter (HOSPITAL_COMMUNITY): Payer: Self-pay

## 2020-03-26 ENCOUNTER — Emergency Department (HOSPITAL_COMMUNITY)
Admission: EM | Admit: 2020-03-26 | Discharge: 2020-03-26 | Disposition: A | Discharge status: Home / Self Care | Payer: BC Managed Care – PPO | Attending: Emergency Medicine | Admitting: Emergency Medicine

## 2020-03-26 DIAGNOSIS — R079 Chest pain, unspecified: Secondary | ICD-10-CM | POA: Diagnosis not present

## 2020-03-26 DIAGNOSIS — R072 Precordial pain: Secondary | ICD-10-CM | POA: Diagnosis not present

## 2020-03-26 DIAGNOSIS — Z87891 Personal history of nicotine dependence: Secondary | ICD-10-CM | POA: Insufficient documentation

## 2020-03-26 DIAGNOSIS — R0602 Shortness of breath: Secondary | ICD-10-CM | POA: Insufficient documentation

## 2020-03-26 DIAGNOSIS — Z95818 Presence of other cardiac implants and grafts: Secondary | ICD-10-CM | POA: Diagnosis not present

## 2020-03-26 DIAGNOSIS — R0789 Other chest pain: Secondary | ICD-10-CM | POA: Diagnosis not present

## 2020-03-26 LAB — CBC
HCT: 46.6 % (ref 39.0–52.0)
Hemoglobin: 15.3 g/dL (ref 13.0–17.0)
MCH: 28.3 pg (ref 26.0–34.0)
MCHC: 32.8 g/dL (ref 30.0–36.0)
MCV: 86.3 fL (ref 80.0–100.0)
Platelets: 253 10*3/uL (ref 150–400)
RBC: 5.4 MIL/uL (ref 4.22–5.81)
RDW: 13 % (ref 11.5–15.5)
WBC: 6.7 10*3/uL (ref 4.0–10.5)
nRBC: 0 % (ref 0.0–0.2)

## 2020-03-26 LAB — BASIC METABOLIC PANEL
Anion gap: 7 (ref 5–15)
BUN: 14 mg/dL (ref 6–20)
CO2: 27 mmol/L (ref 22–32)
Calcium: 9.2 mg/dL (ref 8.9–10.3)
Chloride: 103 mmol/L (ref 98–111)
Creatinine, Ser: 0.99 mg/dL (ref 0.61–1.24)
GFR, Estimated: >60 mL/min (ref >60)
Glucose, Bld: 84 mg/dL (ref 70–99)
Potassium: 4 mmol/L (ref 3.5–5.1)
Sodium: 137 mmol/L (ref 135–145)

## 2020-03-26 LAB — TROPONIN I (HIGH SENSITIVITY)
Troponin I (High Sensitivity): 3 ng/L (ref <18)
Troponin I (High Sensitivity): 4 ng/L (ref <18)

## 2020-03-26 NOTE — Discharge Instructions (Addendum)
Please read instructions below. Follow up with your primary care provider.  Return to the ER for new or worsening symptoms; including worsening chest pain, shortness of breath, pain that radiates to the arm or neck, pain or shortness of breath worsened with exertion.

## 2020-03-26 NOTE — ED Triage Notes (Signed)
Pt presents to ED with complaints of mid chest pressure started 20 minutes ago, also having SOB.

## 2020-03-26 NOTE — ED Provider Notes (Signed)
[facility] EMERGENCY DEPARTMENT Provider Note   CSN: [id number] Arrival date & time: 03/26/20  1409     History Chief Complaint  Patient presents with  . Chest Pain    Ryan Conner is a 39 y.o. male w PMHx HLD, arrhythmia, presenting to the ED with complaint of chest pain and SOB that began about 1 hour prior to arrival. Patient states he was getting into his vehicle to go to work when he began having substernal chest pain with associated SOB. He states this made him panic and he felt like he couldn't catch his breath. He states the deep breaths made his CP worse. His symptoms improved while he drove himself to the ED. He has minimal discomfort in his chest, SOB resolved. He denies assoc palpitations, nausea, diaphoresis. States this reminds him of when he had an arrhythmia in his past which is why he had loop recorder implanted, however he did not feel palpitations this time. He is unable to name the arrhythmia. The loop recorder was supposed to be replaced 3 years ago.   The history is provided by the patient.       Past Medical History:  Diagnosis Date  . Hyperlipidemia   . Status post placement of implantable loop recorder 2014   Medtronic Reveal LINQ - implanted in [place]    There are no problems to display for this patient.   Past Surgical History:  Procedure Laterality Date  . Implantable loop recorder  2014       Family History  Problem Relation Age of Onset  . Hypertension Mother   . Varicose Veins Mother   . Meniere's disease Mother   . Alcoholism Paternal Grandfather     Social History   Tobacco Use  . Smoking status: Former Smoker    Packs/day: 0.50    Years: 20.00    Pack years: 10.00    Start date: 08/04/1992  . Smokeless tobacco: Never Used  Vaping Use  . Vaping Use: Never used  Substance Use Topics  . Alcohol use: Yes    Comment: occasional  . Drug use: No    Home Medications Prior to Admission medications   Medication Sig Start  Date End Date Taking? Authorizing Provider  aspirin-acetaminophen-caffeine (EXCEDRIN MIGRAINE) [id number] MG tablet Take 2 tablets by mouth every 6 (six) hours as needed for headache.   Yes [provider]  ondansetron (ZOFRAN ODT) 4 MG disintegrating tablet Take 1 tablet (4 mg total) by mouth every 8 (eight) hours as needed for nausea or vomiting. Patient not taking: Reported on 03/26/2020 06/17/18   Janora Norlander, DO  predniSONE (DELTASONE) 5 MG tablet Take by mouth. Patient not taking: Reported on 03/26/2020 01/12/20   [provider]    Allergies    Patient has no known allergies.  Review of Systems   Review of Systems  Respiratory: Positive for shortness of breath.   Cardiovascular: Positive for chest pain.  All other systems reviewed and are negative.   Physical Exam Updated Vital Signs BP (!) 138/94   Pulse 69   Temp 98.1 F (36.7 C) (Oral)   Resp 13   Ht 6' (1.829 m)   Wt 108.9 kg   SpO2 100%   BMI 32.55 kg/m   Physical Exam Vitals and nursing note reviewed.  Constitutional:      General: He is not in acute distress.    Appearance: He is well-developed. He is not ill-appearing.  HENT:     Head:  Normocephalic and atraumatic.  Eyes:     Conjunctiva/sclera: Conjunctivae normal.  Cardiovascular:     Rate and Rhythm: Normal rate and regular rhythm.  Pulmonary:     Effort: Pulmonary effort is normal. No respiratory distress.     Breath sounds: Normal breath sounds.  Chest:     Chest wall: Tenderness (lower sternum) present.  Abdominal:     General: Bowel sounds are normal.     Palpations: Abdomen is soft.     Tenderness: There is no abdominal tenderness.  Musculoskeletal:     Right lower leg: No edema.     Left lower leg: No edema.  Skin:    General: Skin is warm.  Neurological:     Mental Status: He is alert.  Psychiatric:        Behavior: Behavior normal.     ED Results / Procedures / Treatments   Labs (all labs ordered are  listed, but only abnormal results are displayed) Labs Reviewed  BASIC METABOLIC PANEL  CBC  TROPONIN I (HIGH SENSITIVITY)  TROPONIN I (HIGH SENSITIVITY)    EKG EKG Interpretation  Date/Time:  Monday March 26 2020 14:14:11 EST Ventricular Rate:  69 PR Interval:  160 QRS Duration: 114 QT Interval:  386 QTC Calculation: 413 R Axis:   70 Text Interpretation: Normal sinus rhythm Baseline wander Artifact Otherwise within normal limits Confirmed by Carmin Muskrat [phone]) on 03/26/2020 4:45:51 PM   Radiology DG Chest 2 View  Result Date: 03/26/2020 CLINICAL DATA:  Chest pain EXAM: CHEST - 2 VIEW COMPARISON:  None. FINDINGS: The heart size and mediastinal contours are within normal limits. Both lungs are clear. No pleural effusion or pneumothorax. The visualized skeletal structures are unremarkable. IMPRESSION: No acute process in the chest. Electronically Signed   By: Macy Mis M.D.   On: 03/26/2020 14:39    Procedures Procedures (including critical care time)  Medications Ordered in ED Medications - No data to display  ED Course  I have reviewed the triage vital signs and the nursing notes.  Pertinent labs & imaging results that were available during my care of the patient were reviewed by me and considered in my medical decision making (see chart for details).    MDM Rules/Calculators/A&P                          Pt presenting with sudden onset of mid chest pain that began today with associated SOB and panic attack. Chest pain is not likely of cardiac or pulmonary etiology d/t presentation, PERC negative, VSS, no tracheal deviation, no JVD or new murmur, RRR, breath sounds equal bilaterally, EKG without acute abnormalities, negative troponin x2, and negative CXR. Patient has old loop recorder from prior hx of arrhythmia. Recorded could not be interrogated as it needed replaced multiple years ago, the battery is dead. He is instructed to follow with PCP and cardiology. No  arrhythmias noted today in the ED. Pt has been advised to return to the ED if CP becomes exertional, associated with diaphoresis or nausea, radiates to left jaw/arm, worsens or becomes concerning in any way. Pt appears reliable for follow up and is agreeable to discharge.   Discussed results, findings, treatment and follow up. Patient advised of return precautions. Patient verbalized understanding and agreed with plan.  Final Clinical Impression(s) / ED Diagnoses Final diagnoses:  Precordial chest pain    Rx / DC Orders ED Discharge Orders    None  Daneshia Tavano, [place] N, PA-C 03/26/20 Selena Lesser, MD 03/26/20 [phone]

## 2020-03-27 ENCOUNTER — Telehealth: Payer: Self-pay

## 2020-03-27 NOTE — Telephone Encounter (Signed)
Spoke with patient, scheduled in first available appointment, with Je on 04/03/2020.

## 2020-04-03 ENCOUNTER — Ambulatory Visit: Payer: Self-pay | Admitting: Nurse Practitioner

## 2020-04-03 ENCOUNTER — Encounter: Payer: Self-pay | Admitting: Family Medicine

## 2020-04-13 DIAGNOSIS — Z20822 Contact with and (suspected) exposure to covid-19: Secondary | ICD-10-CM | POA: Diagnosis not present

## 2020-05-01 ENCOUNTER — Telehealth: Payer: Self-pay

## 2020-05-01 DIAGNOSIS — Z20822 Contact with and (suspected) exposure to covid-19: Secondary | ICD-10-CM | POA: Diagnosis not present

## 2020-05-01 DIAGNOSIS — Z03818 Encounter for observation for suspected exposure to other biological agents ruled out: Secondary | ICD-10-CM | POA: Diagnosis not present

## 2020-05-01 NOTE — Telephone Encounter (Signed)
Pt scheduled in after hours clinic tomorrow evening at 5:30. Informed patient that the provider may call earlier.

## 2020-05-01 NOTE — Telephone Encounter (Signed)
Pt called stating that he doesn't feel good. Says his eyes are burning and watery and he has a cough. Pt says he took a covid test and it was negative.   Wants to know what Dr Dettinger recommends for him to take OTC to help with symptoms.  Please advise and call patient. [phone]

## 2020-05-01 NOTE — Telephone Encounter (Signed)
I would recommend being seen, or going to the Urgent Care, or doing an Evisit.

## 2020-05-01 NOTE — Telephone Encounter (Signed)
Pt denies vision changes. He does have HA that started today. Pt's cough has been present for 3-4 days. He denies nausea/vomiting. Also denies chest pain, arm numbness. No loss of taste or smell. States that he will go to [facility] and check his BP. Pt does not have allergies.

## 2020-05-02 ENCOUNTER — Ambulatory Visit (INDEPENDENT_AMBULATORY_CARE_PROVIDER_SITE_OTHER): Payer: BC Managed Care – PPO | Admitting: Nurse Practitioner

## 2020-05-02 ENCOUNTER — Encounter: Payer: Self-pay | Admitting: Nurse Practitioner

## 2020-05-02 DIAGNOSIS — R059 Cough, unspecified: Secondary | ICD-10-CM

## 2020-05-02 MED ORDER — BENZONATATE 100 MG PO CAPS: 100.0000 mg | ORAL_CAPSULE | Freq: Three times a day (TID) | ORAL | 0 refills | Status: DC | PRN

## 2020-05-02 MED ORDER — PREDNISONE 20 MG PO TABS: ORAL_TABLET | ORAL | 0 refills | Status: DC

## 2020-05-02 NOTE — Progress Notes (Signed)
Virtual Visit via telephone Note Due to COVID-19 pandemic this visit was conducted virtually. This visit type was conducted due to national recommendations for restrictions regarding the COVID-19 Pandemic (e.g. social distancing, sheltering in place) in an effort to limit this patient's exposure and mitigate transmission in our community. All issues noted in this document were discussed and addressed.  A physical exam was not performed with this format.  I connected with Ryan Conner on 05/02/20 at 1:40 by telephone and verified that I am speaking with the correct person using two identifiers. Ryan Conner is currently located at home and no one is currently with  her during visit. The provider, Mary-Margaret Hassell Done, FNP is located in their office at time of visit.  I discussed the limitations, risks, security and privacy concerns of performing an evaluation and management service by telephone and the availability of in person appointments. I also discussed with the patient that there may be a patient responsible charge related to this service. The patient expressed understanding and agreed to proceed.   History and Present Illness:   Chief Complaint: Sinusitis   HPI Patient does televisit c/o for the last 3 days he has had cough and night sweats. Eyes are watery. covid test was negative. He has had covind vaccines and he also had covid several  Months ago and this fells different.    Review of Systems  HENT: Positive for congestion. Negative for sore throat.   Respiratory: Positive for cough. Negative for shortness of breath.   Musculoskeletal: Negative for myalgias.  Neurological: Positive for headaches.     Observations/Objective: Alert and oriented- answers all questions appropriately No distress Hoarse Deep cough oted  Assessment and Plan: Ryan Conner in today with chief complaint of Sinusitis   1. Cough 1. Take meds as prescribed 2. Use a cool mist humidifier  especially during the winter months and when heat has been humid. 3. Use saline nose sprays frequently 4. Saline irrigations of the nose can be very helpful if done frequently.  * 4X daily for 1 week*  * Use of a nettie pot can be helpful with this. Follow directions with this* 5. Drink plenty of fluids 6. Keep thermostat turn down low 7.For any cough or congestion  Use plain Mucinex- regular strength or max strength is fine   * Children- consult with Pharmacist for dosing 8. For fever or aces or pains- take tylenol or ibuprofen appropriate for age and weight.  * for fevers greater than 101 orally you may alternate ibuprofen and tylenol every  3 hours.   Meds ordered this encounter  Medications  . predniSONE (DELTASONE) 20 MG tablet    Sig: 2 po at sametime daily for 5 days    Dispense:  10 tablet    Refill:  0    Order Specific Question:   Supervising Provider    Answer:   Caryl Pina A [id number]  . benzonatate (TESSALON PERLES) 100 MG capsule    Sig: Take 1 capsule (100 mg total) by mouth 3 (three) times daily as needed for cough.    Dispense:  20 capsule    Refill:  0    Order Specific Question:   Supervising Provider    Answer:   Caryl Pina A [[id number]]        Follow Up Instructions: prn    I discussed the assessment and treatment plan with the patient. The patient was provided an opportunity to ask questions and all were answered. The patient  agreed with the plan and demonstrated an understanding of the instructions.   The patient was advised to call back or seek an in-person evaluation if the symptoms worsen or if the condition fails to improve as anticipated.  The above assessment and management plan was discussed with the patient. The patient verbalized understanding of and has agreed to the management plan. Patient is aware to call the clinic if symptoms persist or worsen. Patient is aware when to return to the clinic for a follow-up visit. Patient  educated on when it is appropriate to go to the emergency department.   Time call ended:  1:54  I provided 14 minutes of non-face-to-face time during this encounter.    Mary-Margaret Hassell Done, FNP

## 2020-06-11 ENCOUNTER — Other Ambulatory Visit: Payer: Self-pay

## 2020-06-11 ENCOUNTER — Encounter: Payer: Self-pay | Admitting: Nurse Practitioner

## 2020-06-11 ENCOUNTER — Ambulatory Visit (INDEPENDENT_AMBULATORY_CARE_PROVIDER_SITE_OTHER): Payer: BC Managed Care – PPO | Admitting: Nurse Practitioner

## 2020-06-11 VITALS — BP 113/78 | HR 65 | Temp 96.5°F | Ht 72.0 in | Wt 264.0 lb

## 2020-06-11 DIAGNOSIS — G44229 Chronic tension-type headache, not intractable: Secondary | ICD-10-CM | POA: Diagnosis not present

## 2020-06-11 MED ORDER — PROPRANOLOL HCL 10 MG PO TABS: 10.0000 mg | ORAL_TABLET | Freq: Three times a day (TID) | ORAL | 0 refills | Status: DC

## 2020-06-11 MED ORDER — NURTEC 75 MG PO TBDP: ORAL_TABLET | ORAL | Status: DC

## 2020-06-11 NOTE — Addendum Note (Signed)
Addended by: Ivy Lynn on: 06/11/2020 07:19 PM   Modules accepted: Orders

## 2020-06-11 NOTE — Patient Instructions (Signed)
General Headache Without Cause A headache is pain or discomfort felt around the head or neck area. The specific cause of a headache may not be found. There are many causes and types of headaches. A few common ones are:  Tension headaches.  Migraine headaches.  Cluster headaches.  Chronic daily headaches. Follow these instructions at home: Watch your condition for any changes. Let your health care provider know about them. Take these steps to help with your condition: Managing pain  Take over-the-counter and prescription medicines only as told by your health care provider.  Lie down in a dark, quiet room when you have a headache.  If directed, put ice on your head and neck area: ? Put ice in a plastic bag. ? Place a towel between your skin and the bag. ? Leave the ice on for 20 minutes, 2-3 times per day.  If directed, apply heat to the affected area. Use the heat source that your health care provider recommends, such as a moist heat pack or a heating pad. ? Place a towel between your skin and the heat source. ? Leave the heat on for 20-30 minutes. ? Remove the heat if your skin turns bright red. This is especially important if you are unable to feel pain, heat, or cold. You may have a greater risk of getting burned.  Keep lights dim if bright lights bother you or make your headaches worse.      Eating and drinking  Eat meals on a regular schedule.  If you drink alcohol: ? Limit how much you use to:  0-1 drink a day for women.  0-2 drinks a day for men. ? Be aware of how much alcohol is in your drink. In the [place] one drink equals one 12 oz bottle of beer (355 mL), one 5 oz glass of wine (148 mL), or one 1 oz glass of hard liquor (44 mL).  Stop drinking caffeine, or decrease the amount of caffeine you drink. General instructions  Keep a headache journal to help find out what may trigger your headaches. For example, write down: ? What you eat and drink. ? How much sleep  you get. ? Any change to your diet or medicines.  Try massage or other relaxation techniques.  Limit stress.  Sit up straight, and do not tense your muscles.  Do not use any products that contain nicotine or tobacco, such as cigarettes, e-cigarettes, and chewing tobacco. If you need help quitting, ask your health care provider.  Exercise regularly as told by your health care provider.  Sleep on a regular schedule. Get 7-9 hours of sleep each night, or the amount recommended by your health care provider.  Keep all follow-up visits as told by your health care provider. This is important.   Contact a health care provider if:  Your symptoms are not helped by medicine.  You have a headache that is different from the usual headache.  You have nausea or you vomit.  You have a fever. Get help right away if:  Your headache becomes severe quickly.  Your headache gets worse after moderate to intense physical activity.  You have repeated vomiting.  You have a stiff neck.  You have a loss of vision.  You have problems with speech.  You have pain in the eye or ear.  You have muscular weakness or loss of muscle control.  You lose your balance or have trouble walking.  You feel faint or pass out.  You have   confusion.  You have a seizure. Summary  A headache is pain or discomfort felt around the head or neck area.  There are many causes and types of headaches. In some cases, the cause may not be found.  Keep a headache journal to help find out what may trigger your headaches. Watch your condition for any changes. Let your health care provider know about them.  Contact a health care provider if you have a headache that is different from the usual headache, or if your symptoms are not helped by medicine.  Get help right away if your headache becomes severe, you vomit, you have a loss of vision, you lose your balance, or you have a seizure. This information is not intended to  replace advice given to you by your health care provider. Make sure you discuss any questions you have with your health care provider. Document Revised: 11/16/2017 Document Reviewed: 11/16/2017 Elsevier Patient Education  2021 Elsevier Inc.  

## 2020-06-11 NOTE — Assessment & Plan Note (Signed)
Chronic tension type headache is not new for patient but ongoing for few months.  Patient has tried Excedrin with Sudafed with no therapeutic relief.  Patient is not currently taking any medication for prophylaxis.  Patient reports in the last 2 weeks he has had consistent headaches every single day.  Patient reports pain is worse on the right side causing him to have fatigue, flashes of light and photophobia.  Patient is rating pain a 6-7 out of 10 from a pain scale of 0-10. Provided education to patient with printed handouts given.  Norte given in clinic.  Abortive treatment.  Propranolol started 10 mg tablet 3 times daily for prophylaxis   Follow-up with worsening unresolved symptoms.

## 2020-06-11 NOTE — Progress Notes (Signed)
Acute Office Visit  Subjective:    Patient ID: Ryan Conner, male    DOB: 04-04-1981, 40 y.o.   MRN: [id number]  Chief Complaint  Patient presents with  . Headache    X 2 WEEKS COMES AND GOES    Headache  This is a chronic problem. The current episode started more than 1 month ago. The problem occurs constantly. The problem has been unchanged. The pain is located in the right unilateral region. The pain quality is similar to prior headaches. The quality of the pain is described as aching. The pain is severe. Associated symptoms include photophobia. Pertinent negatives include no abdominal pain, back pain, blurred vision, fever, insomnia, neck pain, numbness, scalp tenderness, sinus pressure, tinnitus or visual change. He has tried NSAIDs and Excedrin for the symptoms. The treatment provided mild relief. There is no history of hypertension.     Past Medical History:  Diagnosis Date  . Hyperlipidemia   . Status post placement of implantable loop recorder 2014   [employer] Reveal LINQ - implanted in [place]    Past Surgical History:  Procedure Laterality Date  . Implantable loop recorder  2014    Family History  Problem Relation Age of Onset  . Hypertension Mother   . Varicose Veins Mother   . Meniere's disease Mother   . Alcoholism Paternal Grandfather     Social History   Socioeconomic History  . Marital status: Married    Spouse name: Not on file  . Number of children: Not on file  . Years of education: Not on file  . Highest education level: Not on file  Occupational History  . Not on file  Tobacco Use  . Smoking status: Former Smoker    Packs/day: 0.50    Years: 20.00    Pack years: 10.00    Start date: 08/04/1992  . Smokeless tobacco: Never Used  Vaping Use  . Vaping Use: Never used  Substance and Sexual Activity  . Alcohol use: Yes    Comment: occasional  . Drug use: No  . Sexual activity: Yes    Birth control/protection: Surgical  Other Topics  Concern  . Not on file  Social History Narrative  . Not on file   Social Determinants of Health   Financial Resource Strain: Not on file  Food Insecurity: Not on file  Transportation Needs: Not on file  Physical Activity: Not on file  Stress: Not on file  Social Connections: Not on file  Intimate Partner Violence: Not on file    Outpatient Medications Prior to Visit  Medication Sig Dispense Refill  . aspirin-acetaminophen-caffeine (EXCEDRIN MIGRAINE) 250-250-65 MG tablet Take 2 tablets by mouth every 6 (six) hours as needed for headache.    . benzonatate (TESSALON PERLES) 100 MG capsule Take 1 capsule (100 mg total) by mouth 3 (three) times daily as needed for cough. 20 capsule 0  . ondansetron (ZOFRAN ODT) 4 MG disintegrating tablet Take 1 tablet (4 mg total) by mouth every 8 (eight) hours as needed for nausea or vomiting. (Patient not taking: Reported on 03/26/2020) 20 tablet 0  . predniSONE (DELTASONE) 20 MG tablet 2 po at sametime daily for 5 days 10 tablet 0   No facility-administered medications prior to visit.    No Known Allergies  Review of Systems  Constitutional: Negative for fever.  HENT: Negative for sinus pressure and tinnitus.   Eyes: Positive for photophobia. Negative for blurred vision.  Gastrointestinal: Negative for abdominal pain.  Musculoskeletal: Negative  for back pain and neck pain.  Neurological: Positive for headaches. Negative for numbness.  Psychiatric/Behavioral: The patient does not have insomnia.   All other systems reviewed and are negative.      Objective:    Physical Exam Vitals reviewed.  Constitutional:      Appearance: He is well-developed.  HENT:     Head: Normocephalic.     Nose: No congestion.     Mouth/Throat:     Mouth: Mucous membranes are moist.  Eyes:     Pupils: Pupils are equal, round, and reactive to light.  Cardiovascular:     Rate and Rhythm: Normal rate and regular rhythm.     Pulses: Normal pulses.     Heart  sounds: Normal heart sounds.  Pulmonary:     Effort: Pulmonary effort is normal.     Breath sounds: Normal breath sounds.  Abdominal:     General: Bowel sounds are normal.  Musculoskeletal:        General: Normal range of motion.     Cervical back: Normal range of motion.  Skin:    General: Skin is warm.  Neurological:     Mental Status: He is alert and oriented to person, place, and time.     Comments: Headache      BP 113/78   Pulse 65   Temp (!) 96.5 F (35.8 C)   Ht 6' (1.829 m)   Wt 264 lb (119.7 kg)   SpO2 99%   BMI 35.80 kg/m  Wt Readings from Last 3 Encounters:  06/11/20 264 lb (119.7 kg)  03/26/20 240 lb (108.9 kg)  06/17/18 223 lb (101.2 kg)    Health Maintenance Due  Topic Date Due  . Hepatitis C Screening  Never done  . COVID-19 Vaccine (1) Never done  . HIV Screening  Never done  . TETANUS/TDAP  Never done  . INFLUENZA VACCINE  Never done    There are no preventive care reminders to display for this patient.   No results found for: TSH Lab Results  Component Value Date   WBC 6.7 03/26/2020   HGB 15.3 03/26/2020   HCT 46.6 03/26/2020   MCV 86.3 03/26/2020   PLT 253 03/26/2020   Lab Results  Component Value Date   NA 137 03/26/2020   K 4.0 03/26/2020   CO2 27 03/26/2020   GLUCOSE 84 03/26/2020   BUN 14 03/26/2020   CREATININE 0.99 03/26/2020   BILITOT 0.2 06/13/2016   ALKPHOS 118 (H) 06/13/2016   AST 23 06/13/2016   ALT 37 06/13/2016   PROT 7.6 06/13/2016   ALBUMIN 4.4 06/13/2016   CALCIUM 9.2 03/26/2020   ANIONGAP 7 03/26/2020   Lab Results  Component Value Date   CHOL 231 (H) 06/13/2016   Lab Results  Component Value Date   HDL 35 (L) 06/13/2016   Lab Results  Component Value Date   LDLCALC 124 (H) 06/13/2016   Lab Results  Component Value Date   TRIG 359 (H) 06/13/2016   Lab Results  Component Value Date   CHOLHDL 6.6 (H) 06/13/2016   No results found for: HGBA1C     Assessment & Plan:   Problem List Items  Addressed This Visit      Nervous and Auditory   Chronic tension-type headache, not intractable - Primary    Chronic tension type headache is not new for patient but ongoing for few months.  Patient has tried Excedrin with Sudafed with no therapeutic relief.  Patient  is not currently taking any medication for prophylaxis.  Patient reports in the last 2 weeks he has had consistent headaches every single day.  Patient reports pain is worse on the right side causing him to have fatigue, flashes of light and photophobia.  Patient is rating pain a 6-7 out of 10 from a pain scale of 0-10. Provided education to patient with printed handouts given.  Norte given in clinic.  Abortive treatment.  Propranolol started 10 mg tablet 3 times daily for prophylaxis   Follow-up with worsening unresolved symptoms.       Relevant Medications   propranolol (INDERAL) 10 MG tablet       Meds ordered this encounter  Medications  . propranolol (INDERAL) 10 MG tablet    Sig: Take 1 tablet (10 mg total) by mouth 3 (three) times daily.    Dispense:  180 tablet    Refill:  0    Order Specific Question:   Supervising Provider    Answer:   Janora Norlander [[id number]]     Ivy Lynn, NP

## 2020-06-13 ENCOUNTER — Telehealth: Payer: Self-pay

## 2020-06-13 ENCOUNTER — Other Ambulatory Visit: Payer: Self-pay | Admitting: Nurse Practitioner

## 2020-06-13 DIAGNOSIS — G44229 Chronic tension-type headache, not intractable: Secondary | ICD-10-CM

## 2020-06-13 NOTE — Telephone Encounter (Signed)
Patient states that pain is not worsening but is not getting better.  Patient doesn't feel like he needs to seek emergency care but maybe have a referral placed

## 2020-06-13 NOTE — Telephone Encounter (Signed)
Patient reports he has been taking the Propranolol but the pain in his head has not improved at all.

## 2020-06-13 NOTE — Telephone Encounter (Signed)
Attempted to contact patient - NA °

## 2020-06-13 NOTE — Telephone Encounter (Signed)
Referral to Neurology completed.

## 2020-06-13 NOTE — Telephone Encounter (Signed)
I gave patient Ryan Conner samples from clinic yesterday please advise patient to use Ryan Conner 75 mg, 1 tablet by mouth once, if no improvement  but worsening symptoms have patient seek emergency care.

## 2020-06-14 ENCOUNTER — Telehealth: Payer: Self-pay

## 2020-06-14 NOTE — Telephone Encounter (Signed)
Pt called stating that we referred him to see a neurologist for his headaches but says the office we referred him to cant see him until April. Wants to know if there is anywhere else the referral can be sent so that he can be seen sooner? Says he has already been dealing with headaches for 3 weeks and can't wait much longer.  Please advise and call pt with update.

## 2020-06-15 NOTE — Telephone Encounter (Signed)
Patient aware.

## 2020-06-19 ENCOUNTER — Encounter: Payer: Self-pay | Admitting: Family Medicine

## 2020-06-19 ENCOUNTER — Other Ambulatory Visit: Payer: Self-pay

## 2020-06-19 ENCOUNTER — Ambulatory Visit (INDEPENDENT_AMBULATORY_CARE_PROVIDER_SITE_OTHER): Payer: BC Managed Care – PPO | Admitting: Family Medicine

## 2020-06-19 VITALS — BP 127/93 | HR 66 | Temp 97.7°F | Resp 20 | Ht 72.0 in | Wt 262.6 lb

## 2020-06-19 DIAGNOSIS — R319 Hematuria, unspecified: Secondary | ICD-10-CM | POA: Diagnosis not present

## 2020-06-19 DIAGNOSIS — R635 Abnormal weight gain: Secondary | ICD-10-CM | POA: Diagnosis not present

## 2020-06-19 DIAGNOSIS — R519 Headache, unspecified: Secondary | ICD-10-CM

## 2020-06-19 DIAGNOSIS — R109 Unspecified abdominal pain: Secondary | ICD-10-CM

## 2020-06-19 LAB — URINALYSIS, COMPLETE
Bilirubin, UA: NEGATIVE
Glucose, UA: NEGATIVE
Ketones, UA: NEGATIVE
Leukocytes,UA: NEGATIVE
Nitrite, UA: NEGATIVE
Protein,UA: NEGATIVE
Specific Gravity, UA: >1.03 — ABNORMAL HIGH (ref 1.005–1.030)
Urobilinogen, Ur: 0.2 mg/dL (ref 0.2–1.0)
pH, UA: 5 (ref 5.0–7.5)

## 2020-06-19 LAB — MICROSCOPIC EXAMINATION: Epithelial Cells (non renal): NONE SEEN /hpf (ref 0–10)

## 2020-06-19 NOTE — Progress Notes (Signed)
Assessment & Plan:  1. Hematuria, unspecified type - Discussed if CT and urine culture are unrevealing, I will refer him to urology. - Urine Culture - Urinalysis, Complete - CT Abdomen Pelvis Wo Contrast; Future  2. Left flank pain - CT Abdomen Pelvis Wo Contrast; Future  3. Frequent headaches - Encouraged to try ibuprofen 400 mg with Tylenol 500 mg.  Keep appointment with headache specialist. - CBC with Differential/Platelet - CMP14+EGFR - Thyroid Panel With TSH  4. Weight gain - CMP14+EGFR - Thyroid Panel With TSH   Follow up plan: Return if symptoms worsen or fail to improve.  Hendricks Limes, MSN, APRN, FNP-C [facility]  Subjective:   Patient ID: Ryan Conner, male    DOB: 1980-09-26, 40 y.o.   MRN: [id number]  HPI: Ryan Conner is a 40 y.o. male presenting on 06/19/2020 for Hematuria (Patient states that he had blood in his urine only on Sunday. Happened one time)  Patient reports he saw blood clots in his urine 2 days ago.  He states initially it was small clots then 2 large clots came out and went to the bottom of the toilet very quickly, then his urine cleared up.  He does recall having left-sided flank pain 2 days prior.  He has a history of kidney stones but states this did not feel similar.  He does report when he saw the clots there was some discomfort with urination.  He reports the same thing happened 6 months ago at which time he went to the ER and they sent him home and told him it was nothing to worry about.  He states no testing was completed at that time.  Patient also reports he has been having headaches every day for the past 4 weeks.  It starts at the back of his head on the right side and goes around to his temple.  They last anywhere from 5 minutes to all day.  He describes them as throbbing.  Denies an aura, increased stress, dizziness, or neck pain.  He does have a history of migraines but states his last migraine was over a year  ago and just does not feel anything similar.  He has had a 25 pound increase in weight in the past month but states he has not changed how he eats and he has been working out and going to the gym.  He drinks coffee in the morning and then for the rest of the day drinks water.  Sometimes he will have a Pepsi.  He was previously prescribed propranolol and Nurtec which were not effective.  He also states OTC medications including NSAIDs, Tylenol, and Excedrin are not effective.  He has an appointment with a headache specialist in April.   ROS: Negative unless specifically indicated above in HPI.   Relevant past medical history reviewed and updated as indicated.   Allergies and medications reviewed and updated.  No current outpatient medications on file.  No Known Allergies  Objective:   BP (!) 127/93   Pulse 66   Temp 97.7 F (36.5 C) (Temporal)   Resp 20   Ht 6' (1.829 m)   Wt 262 lb 9.6 oz (119.1 kg)   SpO2 95%   BMI 35.61 kg/m    Physical Exam Vitals reviewed.  Constitutional:      General: He is not in acute distress.    Appearance: Normal appearance. He is obese. He is not ill-appearing, toxic-appearing or diaphoretic.  HENT:  Head: Normocephalic and atraumatic.  Eyes:     General: No scleral icterus.       Right eye: No discharge.        Left eye: No discharge.     Conjunctiva/sclera: Conjunctivae normal.  Cardiovascular:     Rate and Rhythm: Normal rate.  Pulmonary:     Effort: Pulmonary effort is normal. No respiratory distress.  Abdominal:     Tenderness: There is left CVA tenderness.  Musculoskeletal:        General: Normal range of motion.     Cervical back: Normal range of motion.  Skin:    General: Skin is warm and dry.  Neurological:     Mental Status: He is alert and oriented to person, place, and time. Mental status is at baseline.  Psychiatric:        Mood and Affect: Mood normal.        Behavior: Behavior normal.        Thought Content: Thought  content normal.        Judgment: Judgment normal.

## 2020-06-20 LAB — CMP14+EGFR
ALT: 40 IU/L (ref 0–44)
AST: 23 IU/L (ref 0–40)
Albumin/Globulin Ratio: 1.7 (ref 1.2–2.2)
Albumin: 4.6 g/dL (ref 4.0–5.0)
Alkaline Phosphatase: 124 IU/L — ABNORMAL HIGH (ref 44–121)
BUN/Creatinine Ratio: 15 (ref 9–20)
BUN: 15 mg/dL (ref 6–20)
Bilirubin Total: 0.3 mg/dL (ref 0.0–1.2)
CO2: 18 mmol/L — ABNORMAL LOW (ref 20–29)
Calcium: 9.6 mg/dL (ref 8.7–10.2)
Chloride: 101 mmol/L (ref 96–106)
Creatinine, Ser: 0.99 mg/dL (ref 0.76–1.27)
GFR calc Af Amer: 110 mL/min/{1.73_m2} (ref >59)
GFR calc non Af Amer: 96 mL/min/{1.73_m2} (ref >59)
Globulin, Total: 2.7 g/dL (ref 1.5–4.5)
Glucose: 98 mg/dL (ref 65–99)
Potassium: 4.5 mmol/L (ref 3.5–5.2)
Sodium: 137 mmol/L (ref 134–144)
Total Protein: 7.3 g/dL (ref 6.0–8.5)

## 2020-06-20 LAB — URINE CULTURE

## 2020-06-20 LAB — CBC WITH DIFFERENTIAL/PLATELET
Basophils Absolute: 0 10*3/uL (ref 0.0–0.2)
Basos: 1 %
EOS (ABSOLUTE): 0.1 10*3/uL (ref 0.0–0.4)
Eos: 2 %
Hematocrit: 44.8 % (ref 37.5–51.0)
Hemoglobin: 15.6 g/dL (ref 13.0–17.7)
Immature Grans (Abs): 0 10*3/uL (ref 0.0–0.1)
Immature Granulocytes: 0 %
Lymphocytes Absolute: 2.6 10*3/uL (ref 0.7–3.1)
Lymphs: 40 %
MCH: 28.8 pg (ref 26.6–33.0)
MCHC: 34.8 g/dL (ref 31.5–35.7)
MCV: 83 fL (ref 79–97)
Monocytes Absolute: 0.6 10*3/uL (ref 0.1–0.9)
Monocytes: 9 %
Neutrophils Absolute: 3.1 10*3/uL (ref 1.4–7.0)
Neutrophils: 48 %
Platelets: 259 10*3/uL (ref 150–450)
RBC: 5.42 x10E6/uL (ref 4.14–5.80)
RDW: 13.5 % (ref 11.6–15.4)
WBC: 6.5 10*3/uL (ref 3.4–10.8)

## 2020-06-20 LAB — THYROID PANEL WITH TSH
Free Thyroxine Index: 1.7 (ref 1.2–4.9)
T3 Uptake Ratio: 28 % (ref 24–39)
T4, Total: 6.1 ug/dL (ref 4.5–12.0)
TSH: 1.73 u[IU]/mL (ref 0.450–4.500)

## 2020-06-21 ENCOUNTER — Encounter: Payer: Self-pay | Admitting: Family Medicine

## 2020-06-22 NOTE — Telephone Encounter (Signed)
Left detailed message per DPR  

## 2020-06-22 NOTE — Telephone Encounter (Signed)
His urine and lab work have already been reviewed and resulted to him.  His urine culture did not grow bacteria to be clinically significant.  No he does not need an antibiotic.

## 2020-07-03 NOTE — Progress Notes (Signed)
[id number]    Provider:  Dr Jaynee Eagles Requesting Provider: Ivy Lynn, NP Primary Care Provider:  Dettinger, Fransisca Kaufmann, MD  CC:  Daily headache  HPI:  Ryan Conner is a 40 y.o. male here as requested by Ivy Lynn, NP for headache.  Past medical history hyperlipidemia, headaches, implantable loop recorder.  I reviewed Ryan Marlin, NP's notes: Patient with a headache, when last seen the current episode had started more than 1 month ago, problem occurring constantly and unchanged, right unilateral region, pain quality similar to prior headaches, aching, severe, associated photophobia, NSAIDs and Excedrin did not help, no history of hypertension, over the prior 2 weeks having consistent headaches every single day, pain is worse on the right side causing him to have fatigue, flashes of light and photophobia, 7 out of 10 pain, propranolol was started for prophylaxis.  Ongoing for 3 months, this is a new headache. He has had migraines in the past but this is different. Starts at the occipital area and shoots quickly to the right temple, inbetween is throbbing, brief severe sharp pain. Was worse but now 4/10. It was daily. Now it is better 1-2x a week. Unknown inciting event. Worse in the morning after sleeping on it. Discussed causes, reviewed anatomy online, discussed nerve blocks, RFA, medications. No radicular symptoms, no weakness. Nurtec did not help. Nothing helps. No other focal neurologic deficits, associated symptoms, inciting events or modifiable factors.  Reviewed notes, labs and imaging from outside physicians, which showed:  February 2022: TSH normal, CBC normal, CMP unremarkable.  Medications tried that can be used in migraine management include: Excedrin, Zofran, prednisone tablets, propranolol tablets, Nurtec   Review of Systems: Patient complains of symptoms per HPI as well as the following symptoms: neck tightness, migraines Pertinent negatives and  positives per HPI. All others negative.   Social History   Socioeconomic History  . Marital status: Married    Spouse name: Not on file  . Number of children: 2  . Years of education: Not on file  . Highest education level: Not on file  Occupational History  . Not on file  Tobacco Use  . Smoking status: Former Smoker    Packs/day: 0.50    Years: 20.00    Pack years: 10.00    Start date: 08/04/1992    Quit date: 12/2019    Years since quitting: 0.5  . Smokeless tobacco: Never Used  Vaping Use  . Vaping Use: Never used  Substance and Sexual Activity  . Alcohol use: Yes    Comment: occasional  . Drug use: No  . Sexual activity: Yes    Birth control/protection: Surgical  Other Topics Concern  . Not on file  Social History Narrative   Lives at home with wife and children   Right handed   Caffeine: coffee, approx 18 oz/day    Social Determinants of Health   Financial Resource Strain: Not on file  Food Insecurity: Not on file  Transportation Needs: Not on file  Physical Activity: Not on file  Stress: Not on file  Social Connections: Not on file  Intimate Partner Violence: Not on file    Family History  Problem Relation Age of Onset  . Hypertension Mother   . Varicose Veins Mother   . Meniere's disease Mother   . Migraines Mother        used to get them alot more, also had vertigo   . Alcoholism Paternal Grandfather     Past  Medical History:  Diagnosis Date  . Hyperlipidemia   . Status post placement of implantable loop recorder 2014   Medtr[employer] Reveal LINQ - implanted in [place]    Patient Active Problem List   Diagnosis Date Noted  . Occipital neuralgia of right side 07/04/2020  . Chronic tension-type headache, not intractable 06/11/2020    Past Surgical History:  Procedure Laterality Date  . Implantable loop recorder  2014  . varicose veins removed   2012   bilateral legs     Current Outpatient Medications  Medication Sig Dispense Refill  .  methylPREDNISolone (MEDROL DOSEPAK) 4 MG TBPK tablet Take pills all together daily with food for 6 days. 6-5-4-3-2-1 21 tablet 1   No current facility-administered medications for this visit.    Allergies as of 07/04/2020  . (No Known Allergies)    Vitals: BP 121/81 (BP Location: Right Arm, Patient Position: Sitting)   Pulse (!) 58   Ht 6' (1.829 m)   Wt 266 lb (120.7 kg)   BMI 36.08 kg/m  Last Weight:  Wt Readings from Last 1 Encounters:  07/04/20 266 lb (120.7 kg)   Last Height:   Ht Readings from Last 1 Encounters:  07/04/20 6' (1.829 m)     Physical exam: Exam: Gen: NAD, conversant, well nourised, obese, well groomed                     CV: RRR, no MRG. No Carotid Bruits. No peripheral edema, warm, nontender Eyes: Conjunctivae clear without exudates or hemorrhage  Neuro: Detailed Neurologic Exam  Speech:    Speech is normal; fluent and spontaneous with normal comprehension.  Cognition:    The patient is oriented to person, place, and time;     recent and remote memory intact;     language fluent;     normal attention, concentration,     fund of knowledge Cranial Nerves:    The pupils are equal, round, and reactive to light. The fundi are flat. Visual fields are full to finger confrontation. Extraocular movements are intact. Trigeminal sensation is intact and the muscles of mastication are normal. The face is symmetric. The palate elevates in the midline. Hearing intact. Voice is normal. Shoulder shrug is normal. The tongue has normal motion without fasciculations.   Coordination:  No dysmetria or ataxia  Gait:    Normal native gait  Motor Observation:    No asymmetry, no atrophy, and no involuntary movements noted. Tone:    Normal muscle tone.    Posture:    Posture is normal. normal erect    Strength:    Strength is V/V in the upper and lower limbs.      Sensation: intact to LT     Reflex Exam:  DTR's:    Deep tendon reflexes in the upper and  lower extremities are normal bilaterally.   Toes:    The toes are downgoing bilaterally.   Clonus:    Clonus is absent.    Assessment/Plan:  Nice 40 year old patient with right-sided occipital neuralgia.  Discussed causes, reviewed anatomy online, discussed nerve blocks, RFA, medications. No radicular symptoms, no weakness. Neuro exam normal, no indication for MRI brain or cervical spine at this time. Provided nerve blocks which reduced pain significantly. Start a medrol dosepak. Can come back if needed.  Meds ordered this encounter  Medications  . methylPREDNISolone (MEDROL DOSEPAK) 4 MG TBPK tablet    Sig: Take pills all together daily with food for  6 days. 6-5-4-3-2-1    Dispense:  21 tablet    Refill:  1    Performed by Dr. Jaynee Eagles M.D. 66ml Lidocaine 1%,78ml Marcaine 0.5% in the 30-gauge needle was used. All procedures a documented blood were medically necessary, reasonable and appropriate based on the patient's history, medical diagnosis and physician opinion. Verbal informed consent was obtained from the patient, patient was informed of potential risk of procedure, including bruising, bleeding, hematoma formation, infection, muscle weakness, muscle pain, numbness, transient hypertension, transient hyperglycemia and transient insomnia among others. All areas injected were topically clean with isopropyl rubbing alcohol. Nonsterile nonlatex gloves were worn during the procedure.  1. Greater occipital nerve block [phone]). The greater occipital nerve site was identified at the nuchal line medial to the occipital artery. Medication was injected into the right occipital nerve areas and suboccipital areas. Patient's condition is associated with inflammation of the greater occipital nerve and associated multiple groups. Injection was deemed medically necessary, reasonable and appropriate. Injection represents a separate and unique surgical service.  Cc: Ivy Lynn, NP,  Dettinger, Fransisca Kaufmann,  MD  Sarina Ill, MD  [facility] [address] [place], [place] [address]  Phone [phone] Fax [phone]

## 2020-07-04 ENCOUNTER — Encounter: Payer: Self-pay | Admitting: Neurology

## 2020-07-04 ENCOUNTER — Ambulatory Visit (INDEPENDENT_AMBULATORY_CARE_PROVIDER_SITE_OTHER): Payer: BC Managed Care – PPO | Admitting: Neurology

## 2020-07-04 VITALS — BP 121/81 | HR 58 | Ht 72.0 in | Wt 266.0 lb

## 2020-07-04 DIAGNOSIS — M5481 Occipital neuralgia: Secondary | ICD-10-CM

## 2020-07-04 MED ORDER — METHYLPREDNISOLONE 4 MG PO TBPK: ORAL_TABLET | ORAL | 1 refills | Status: DC

## 2020-07-04 NOTE — Patient Instructions (Signed)
Occipital Neuralgia  Occipital neuralgia is a type of headache that causes brief episodes of very bad pain in the back of the head. Pain from occipital neuralgia may spread (radiate) to other parts of the head. These headaches may be caused by irritation of the nerves that leave the spinal cord high up in the neck, just below the base of the skull (occipital nerves). The occipital nerves transmit sensations from the back of the head, the top of the head, and the areas behind the ears. What are the causes? This condition can occur without any known cause (primary headache syndrome). In other cases, this condition is caused by pressure on or irritation of one of the two occipital nerves. Pressure and irritation may be due to:  Muscle spasm in the neck.  Neck injury.  Wear and tear of the vertebrae in the neck (osteoarthritis).  Disease of the disks that separate the vertebrae.  Swollen blood vessels that put pressure on the occipital nerves.  Infections.  Tumors.  Diabetes. What are the signs or symptoms? This condition causes brief burning, stabbing, electric, shocking, or shooting pain in the back of the head that can radiate to the top of the head. It can happen on one side or both sides of the head. It can also cause:  Pain behind the eye.  Pain triggered by neck movement or hair brushing.  Scalp tenderness.  Aching in the back of the head between episodes of very bad pain.  Pain that gets worse with exposure to bright lights. How is this diagnosed? Your health care provider may diagnose the condition based on a physical exam and your symptoms. Tests may be done, such as:  Imaging studies of the brain and neck (cervical spine), such as an MRI or CT scan. These look for causes of pinched nerves.  Applying pressure to the nerves in the neck to try to re-create the pain.  Injection of numbing medicine into the occipital nerve areas to see if pain goes away (diagnostic nerve  block).   How is this treated? Treatment for this condition may begin with simple measures, such as:  Rest.  Massage.  Applying heat or cold to the area.  Over-the-counter pain relievers. If these measures do not work, you may need other treatments, including:  Medicines, such as: ? Prescription-strength anti-inflammatory medicines. ? Muscle relaxants. ? Anti-seizure medicines, which can relieve pain. ? Antidepressants, which can relieve pain. ? Injected medicines, such as medicines that numb the area (local anesthetic) and steroids.  Pulsed radiofrequency ablation. This is when wires are implanted to deliver electrical impulses that block pain signals from the occipital nerve.  Surgery to relieve nerve pressure.  Physical therapy. Follow these instructions at home: Managing pain  Avoid any activities that cause pain.  Rest when you have an attack of pain.  Try gentle massage to relieve pain.  Try a different pillow or sleeping position.  If directed, apply heat to the affected area as often as told by your health care provider. Use the heat source that your health care provider recommends, such as a moist heat pack or a heating pad. ? Place a towel between your skin and the heat source. ? Leave the heat on for 20-30 minutes. ? Remove the heat if your skin turns bright red. This is especially important if you are unable to feel pain, heat, or cold. You have a greater risk of getting burned.  If directed, put ice on the back of your  head and neck area. To do this: ? Put ice in a plastic bag. ? Place a towel between your skin and the bag. ? Leave the ice on for 20 minutes, 2-3 times a day. ? Remove the ice if your skin turns bright red. This is very important. If you cannot feel pain, heat, or cold, you have a greater risk of damage to the area.      General instructions  Take over-the-counter and prescription medicines only as told by your health care  provider.  Avoid things that make your symptoms worse, such as bright lights.  Try to stay active. Get regular exercise that does not cause pain. Ask your health care provider to suggest safe exercises for you.  Work with a physical therapist to learn stretching exercises you can do at home.  Practice good posture.  Keep all follow-up visits. This is important. Contact a health care provider if:  Your medicine is not working.  You have new or worsening symptoms. Get help right away if:  You have very bad head pain that does not go away.  You have a sudden change in vision, balance, or speech. These symptoms may represent a serious problem that is an emergency. Do not wait to see if the symptoms will go away. Get medical help right away. Call your local emergency services (911 in the [place] Do not drive yourself to the hospital. Summary  Occipital neuralgia is a type of headache that causes brief episodes of very bad pain in the back of the head.  Pain from occipital neuralgia may spread (radiate) to other parts of the head.  Treatment for this condition includes rest, massage, and medicines. This information is not intended to replace advice given to you by your health care provider. Make sure you discuss any questions you have with your health care provider. Document Revised: 02/26/2020 Document Reviewed: 02/26/2020 Elsevier Patient Education  2021 [place]. Methylprednisolone tablets What is this medicine? METHYLPREDNISOLONE (meth ill pred NISS [place]) is a corticosteroid. It is commonly used to treat inflammation of the skin, joints, lungs, and other organs. Common conditions treated include asthma, allergies, and arthritis. It is also used for other conditions, such as blood disorders and diseases of the adrenal glands. This medicine may be used for other purposes; ask your health care provider or pharmacist if you have questions. COMMON BRAND NAME(S): Medrol, Medrol  Dosepak What should I tell my health care provider before I take this medicine? They need to know if you have any of these conditions:  Cushing's syndrome  eye disease, vision problems  diabetes  glaucoma  heart disease  high blood pressure  infection (especially a virus infection such as chickenpox, cold sores, or herpes)  liver disease  mental illness  myasthenia gravis  osteoporosis  recently received or scheduled to receive a vaccine  seizures  stomach or intestine problems  thyroid disease  an unusual or allergic reaction to lactose, methylprednisolone, other medicines, foods, dyes, or preservatives  pregnant or trying to get pregnant  breast-feeding How should I use this medicine? Take this medicine by mouth with a glass of water. Follow the directions on the prescription label. Take this medicine with food. If you are taking this medicine once a day, take it in the morning. Do not take it more often than directed. Do not suddenly stop taking your medicine because you may develop a severe reaction. Your doctor will tell you how much medicine to take. If your  doctor wants you to stop the medicine, the dose may be slowly lowered over time to avoid any side effects. Talk to your pediatrician regarding the use of this medicine in children. Special care may be needed. Overdosage: If you think you have taken too much of this medicine contact a poison control center or emergency room at once. NOTE: This medicine is only for you. Do not share this medicine with others. What if I miss a dose? If you miss a dose, take it as soon as you can. If it is almost time for your next dose, talk to your doctor or health care professional. You may need to miss a dose or take an extra dose. Do not take double or extra doses without advice. What may interact with this medicine? Do not take this medicine with any of the following medications:  alefacept  echinacea  live virus  vaccines  metyrapone  mifepristone This medicine may also interact with the following medications:  amphotericin B  aspirin and aspirin-like medicines  certain antibiotics like erythromycin, clarithromycin, troleandomycin  certain medicines for diabetes  certain medicines for fungal infections like ketoconazole  certain medicines for seizures like carbamazepine, phenobarbital, phenytoin  certain medicines that treat or prevent blood clots like warfarin  cholestyramine  cyclosporine  digoxin  diuretics  male hormones, like estrogens and birth control pills  isoniazid  NSAIDs, medicines for pain inflammation, like ibuprofen or naproxen  other medicines for myasthenia gravis  rifampin  vaccines This list may not describe all possible interactions. Give your health care provider a list of all the medicines, herbs, non-prescription drugs, or dietary supplements you use. Also tell them if you smoke, drink alcohol, or use illegal drugs. Some items may interact with your medicine. What should I watch for while using this medicine? Tell your doctor or healthcare professional if your symptoms do not start to get better or if they get worse. Do not stop taking except on your doctor's advice. You may develop a severe reaction. Your doctor will tell you how much medicine to take. This medicine may increase your risk of getting an infection. Tell your doctor or health care professional if you are around anyone with measles or chickenpox, or if you develop sores or blisters that do not heal properly. This medicine may increase blood sugar levels. Ask your healthcare provider if changes in diet or medicines are needed if you have diabetes. Tell your doctor or health care professional right away if you have any change in your eyesight. Using this medicine for a long time may increase your risk of low bone mass. Talk to your doctor about bone health. What side effects may I notice from  receiving this medicine? Side effects that you should report to your doctor or health care professional as soon as possible:  allergic reactions like skin rash, itching or hives, swelling of the face, lips, or tongue  bloody or tarry stools  hallucination, loss of contact with reality  muscle cramps  muscle pain  palpitations  signs and symptoms of high blood sugar such as being more thirsty or hungry or having to urinate more than normal. You may also feel very tired or have blurry vision.  signs and symptoms of infection like fever or chills; cough; sore throat; pain or trouble passing urine Side effects that usually do not require medical attention (report to your doctor or health care professional if they continue or are bothersome):  changes in emotions or mood  constipation  diarrhea  excessive hair growth on the face or body  headache  nausea, vomiting  trouble sleeping  weight gain This list may not describe all possible side effects. Call your doctor for medical advice about side effects. You may report side effects to FDA at 1-800-FDA-1088. Where should I keep my medicine? Keep out of the reach of children. Store at room temperature between 20 and 25 degrees C (68 and 77 degrees F). Throw away any unused medicine after the expiration date. NOTE: This sheet is a summary. It may not cover all possible information. If you have questions about this medicine, talk to your doctor, pharmacist, or health care provider.  2021 Elsevier/Gold Standard (2018-01-28 09:19:36)

## 2020-07-16 ENCOUNTER — Other Ambulatory Visit: Payer: Self-pay

## 2020-07-16 ENCOUNTER — Ambulatory Visit (HOSPITAL_COMMUNITY)
Admission: RE | Admit: 2020-07-16 | Discharge: 2020-07-16 | Disposition: A | Discharge status: Home / Self Care | Payer: BC Managed Care – PPO | Source: Ambulatory Visit | Attending: Family Medicine | Admitting: Family Medicine

## 2020-07-16 DIAGNOSIS — R319 Hematuria, unspecified: Secondary | ICD-10-CM | POA: Insufficient documentation

## 2020-07-16 DIAGNOSIS — R109 Unspecified abdominal pain: Secondary | ICD-10-CM | POA: Diagnosis not present

## 2020-07-16 DIAGNOSIS — R310 Gross hematuria: Secondary | ICD-10-CM | POA: Diagnosis not present

## 2020-07-16 DIAGNOSIS — Z87442 Personal history of urinary calculi: Secondary | ICD-10-CM | POA: Diagnosis not present

## 2020-08-02 ENCOUNTER — Encounter: Payer: Self-pay | Admitting: Family Medicine

## 2020-08-02 ENCOUNTER — Ambulatory Visit (INDEPENDENT_AMBULATORY_CARE_PROVIDER_SITE_OTHER): Payer: BC Managed Care – PPO

## 2020-08-02 ENCOUNTER — Ambulatory Visit (INDEPENDENT_AMBULATORY_CARE_PROVIDER_SITE_OTHER): Payer: BC Managed Care – PPO | Admitting: Family Medicine

## 2020-08-02 ENCOUNTER — Other Ambulatory Visit: Payer: Self-pay

## 2020-08-02 VITALS — BP 114/79 | HR 71 | Temp 97.9°F | Ht 72.0 in | Wt 261.2 lb

## 2020-08-02 DIAGNOSIS — M25561 Pain in right knee: Secondary | ICD-10-CM | POA: Diagnosis not present

## 2020-08-02 DIAGNOSIS — M7989 Other specified soft tissue disorders: Secondary | ICD-10-CM | POA: Diagnosis not present

## 2020-08-02 MED ORDER — METHYLPREDNISOLONE ACETATE 40 MG/ML IJ SUSP
40.0000 mg | Freq: Once | INTRAMUSCULAR | Status: AC
  Administered 2020-08-02: 80 mg via INTRAMUSCULAR

## 2020-08-02 MED ORDER — PREDNISONE 20 MG PO TABS: ORAL_TABLET | ORAL | 0 refills | Status: DC

## 2020-08-02 NOTE — Progress Notes (Signed)
Acute Office Visit  Subjective:    Patient ID: Ryan Conner, male    DOB: [birth year], 40 y.o.   MRN: [id number]  Chief Complaint  Patient presents with  . Knee Pain    HPI Patient is in today for right knee pain x 4 days. The pain is constant and sharp. The is located on the anterior portion below the knee cap and also along the medial joint pain. Pain is a 4-8/10. The pain is worse with walking up stairs. Nothing seems to make the pain better. Denies injury prior to this. Denies fever. Reports popping, clicking in this knee. Denies catching. He works in a position that requires frequent squatting and climbing of stairs. He has not tried anything for the pain.   Past Medical History:  Diagnosis Date  . Hyperlipidemia   . Status post placement of implantable loop recorder 2014   Medtronic Reveal LINQ - implanted in [place]    Past Surgical History:  Procedure Laterality Date  . Implantable loop recorder  2014  . varicose veins removed   2012   bilateral legs     Family History  Problem Relation Age of Onset  . Hypertension Mother   . Varicose Veins Mother   . Meniere's disease Mother   . Migraines Mother        used to get them alot more, also had vertigo   . Alcoholism Paternal Grandfather     Social History   Socioeconomic History  . Marital status: Married    Spouse name: Not on file  . Number of children: 2  . Years of education: Not on file  . Highest education level: Not on file  Occupational History  . Not on file  Tobacco Use  . Smoking status: Former Smoker    Packs/day: 0.50    Years: 20.00    Pack years: 10.00    Start date: 08/04/1992    Quit date: 12/2019    Years since quitting: 0.6  . Smokeless tobacco: Never Used  Vaping Use  . Vaping Use: Never used  Substance and Sexual Activity  . Alcohol use: Yes    Comment: occasional  . Drug use: No  . Sexual activity: Yes    Birth control/protection: Surgical  Other Topics Concern  . Not on  file  Social History Narrative   Lives at home with wife and children   Right handed   Caffeine: coffee, approx 18 oz/day    Social Determinants of Health   Financial Resource Strain: Not on file  Food Insecurity: Not on file  Transportation Needs: Not on file  Physical Activity: Not on file  Stress: Not on file  Social Connections: Not on file  Intimate Partner Violence: Not on file    Outpatient Medications Prior to Visit  Medication Sig Dispense Refill  . methylPREDNISolone (MEDROL DOSEPAK) 4 MG TBPK tablet Take pills all together daily with food for 6 days. 6-5-4-3-2-1 21 tablet 1   No facility-administered medications prior to visit.    No Known Allergies  Review of Systems As per HPI.     Objective:    Physical Exam Vitals and nursing note reviewed.  Constitutional:      General: He is not in acute distress.    Appearance: Normal appearance. He is not ill-appearing, toxic-appearing or diaphoretic.  Pulmonary:     Effort: Pulmonary effort is normal. No respiratory distress.  Musculoskeletal:     Right knee: Swelling and crepitus present. No erythema.  Normal range of motion. Tenderness present over the medial joint line and patellar tendon.     Instability Tests: Lateral McMurray test positive.     Right lower leg: No edema.     Left lower leg: No edema.  Skin:    General: Skin is warm and dry.     Findings: No erythema.  Neurological:     General: No focal deficit present.     Mental Status: He is alert and oriented to person, place, and time.     Motor: No weakness.     Gait: Gait normal.  Psychiatric:        Mood and Affect: Mood normal.        Behavior: Behavior normal.     BP 114/79   Pulse 71   Temp 97.9 F (36.6 C) (Temporal)   Ht 6' (1.829 m)   Wt 261 lb 4 oz (118.5 kg)   BMI 35.43 kg/m  Wt Readings from Last 3 Encounters:  08/02/20 261 lb 4 oz (118.5 kg)  07/04/20 266 lb (120.7 kg)  06/19/20 262 lb 9.6 oz (119.1 kg)    Health  Maintenance Due  Topic Date Due  . Hepatitis C Screening  Never done  . COVID-19 Vaccine (1) Never done  . HIV Screening  Never done  . TETANUS/TDAP  Never done    There are no preventive care reminders to display for this patient.   Lab Results  Component Value Date   TSH 1.730 06/19/2020   Lab Results  Component Value Date   WBC 6.5 06/19/2020   HGB 15.6 06/19/2020   HCT 44.8 06/19/2020   MCV 83 06/19/2020   PLT 259 06/19/2020   Lab Results  Component Value Date   NA 137 06/19/2020   K 4.5 06/19/2020   CO2 18 (L) 06/19/2020   GLUCOSE 98 06/19/2020   BUN 15 06/19/2020   CREATININE 0.99 06/19/2020   BILITOT 0.3 06/19/2020   ALKPHOS 124 (H) 06/19/2020   AST 23 06/19/2020   ALT 40 06/19/2020   PROT 7.3 06/19/2020   ALBUMIN 4.6 06/19/2020   CALCIUM 9.6 06/19/2020   ANIONGAP 7 03/26/2020   Lab Results  Component Value Date   CHOL 231 (H) 06/13/2016   Lab Results  Component Value Date   HDL 35 (L) 06/13/2016   Lab Results  Component Value Date   LDLCALC 124 (H) 06/13/2016   Lab Results  Component Value Date   TRIG 359 (H) 06/13/2016   Lab Results  Component Value Date   CHOLHDL 6.6 (H) 06/13/2016   No results found for: HGBA1C     Assessment & Plan:   [name] was seen today for knee pain.  Diagnoses and all orders for this visit:  Acute pain of right knee Tenderness of patellar tendon and + lateral McMurray on exam today. Xray today in office, radiology report pending. Steroid IM injection today, start prednisone burst tomorrow. Referral to ortho placed for possible meniscus pathology. Return to office for new or worsening symptoms, or if symptoms persist.  -     predniSONE (DELTASONE) 20 MG tablet; 2 po at sametime daily for 5 days- start tomorrow -     Ambulatory referral to Orthopedic Surgery -     methylPREDNISolone acetate (DEPO-MEDROL) injection 40 mg -     DG Knee 1-2 Views Right; Future  The patient indicates understanding of these issues  and agrees with the plan.  Gwenlyn Perking, FNP

## 2020-08-02 NOTE — Patient Instructions (Signed)
Acute Knee Pain, Adult Acute knee pain is sudden and may be caused by damage, swelling, or irritation of the muscles and tissues that support the knee. Pain may result from:  A fall.  An injury to the knee from twisting motions.  A hit to the knee.  Infection. Acute knee pain may go away on its own with time and rest. If it does not, your health care provider may order tests to find the cause of the pain. These may include:  Imaging tests, such as an X-ray, MRI, CT scan, or ultrasound.  Joint aspiration. In this test, fluid is removed from the knee and evaluated.  Arthroscopy. In this test, a lighted tube is inserted into the knee and an image is projected onto a TV screen.  Biopsy. In this test, a sample of tissue is removed from the body and studied under a microscope. Follow these instructions at home: If you have a knee sleeve or brace:  Wear the knee sleeve or brace as told by your health care provider. Remove it only as told by your health care provider.  Loosen it if your toes tingle, become numb, or turn cold and blue.  Keep it clean.  If the knee sleeve or brace is not waterproof: ? Do not let it get wet. ? Cover it with a watertight covering when you take a bath or shower.   Activity  Rest your knee.  Do not do things that cause pain or make pain worse.  Avoid high-impact activities or exercises, such as running, jumping rope, or doing jumping jacks.  Work with a physical therapist to make a safe exercise program, as recommended by your health care provider. Do exercises as told by your physical therapist. Managing pain, stiffness, and swelling  If directed, put ice on the affected knee. To do this: ? If you have a removable knee sleeve or brace, remove it as told by your health care provider. ? Put ice in a plastic bag. ? Place a towel between your skin and the bag. ? Leave the ice on for 20 minutes, 2-3 times a day. ? Remove the ice if your skin turns bright  red. This is very important. If you cannot feel pain, heat, or cold, you have a greater risk of damage to the area.  If directed, use an elastic bandage to put pressure (compression) on your injured knee. This may control swelling, give support, and help with discomfort.  Raise (elevate) your knee above the level of your heart while you are sitting or lying down.  Sleep with a pillow under your knee.   General instructions  Take over-the-counter and prescription medicines only as told by your health care provider.  Do not use any products that contain nicotine or tobacco, such as cigarettes, e-cigarettes, and chewing tobacco. If you need help quitting, ask your health care provider.  If you are overweight, work with your health care provider and a dietitian to set a weight-loss goal that is healthy and reasonable for you. Extra weight can put pressure on your knee.  Pay attention to any changes in your symptoms.  Keep all follow-up visits. This is important. Contact a health care provider if:  Your knee pain continues, changes, or gets worse.  You have a fever along with knee pain.  Your knee feels warm to the touch or is red.  Your knee buckles or locks up. Get help right away if:  Your knee swells, and the swelling becomes   worse.  You cannot move your knee.  You have severe pain in your knee that cannot be managed with pain medicine. Summary  Acute knee pain can be caused by a fall, an injury, an infection, or damage, swelling, or irritation of the tissues that support your knee.  Your health care provider may perform tests to find out the cause of the pain.  Pay attention to any changes in your symptoms. Relieve your pain with rest, medicines, light activity, and the use of ice.  Get help right away if your knee swells, you cannot move your knee, or you have severe pain that cannot be managed with medicine. This information is not intended to replace advice given to you  by your health care provider. Make sure you discuss any questions you have with your health care provider. Document Revised: 10/12/2019 Document Reviewed: 10/12/2019 Elsevier Patient Education  2021 Elsevier Inc.  

## 2020-08-03 ENCOUNTER — Telehealth: Payer: Self-pay

## 2020-08-03 DIAGNOSIS — R319 Hematuria, unspecified: Secondary | ICD-10-CM

## 2020-08-03 NOTE — Telephone Encounter (Signed)
REFERRAL REQUEST Telephone Note  Have you been seen at our office for this problem? yes (Advise that they may need an appointment with their PCP before a referral can be done)  Reason for Referral: urine has blood in it Referral discussed with patient: ? Best contact number of patient for referral team:    Has patient been seen by a specialist for this issue before: ? Patient provider preference for referral:  Patient location preference for referral: [place] or [facility]   Patient notified that referrals can take up to a week or longer to process. If they haven't heard anything within a week they should call back and speak with the referral department.   Dettinger's pt  He has been seen 3 times for this & he wants a referral now.

## 2020-08-07 ENCOUNTER — Ambulatory Visit (INDEPENDENT_AMBULATORY_CARE_PROVIDER_SITE_OTHER): Payer: BC Managed Care – PPO | Admitting: Orthopaedic Surgery

## 2020-08-07 ENCOUNTER — Other Ambulatory Visit: Payer: Self-pay

## 2020-08-07 ENCOUNTER — Encounter: Payer: Self-pay | Admitting: Orthopaedic Surgery

## 2020-08-07 VITALS — BP 134/93 | HR 58 | Ht 72.0 in | Wt 263.5 lb

## 2020-08-07 DIAGNOSIS — M25561 Pain in right knee: Secondary | ICD-10-CM

## 2020-08-07 MED ORDER — NAPROXEN 500 MG PO TABS: 500.0000 mg | ORAL_TABLET | Freq: Two times a day (BID) | ORAL | 5 refills | Status: DC

## 2020-08-07 NOTE — Progress Notes (Signed)
Subjective:    Patient ID: Ryan Conner, male    DOB: 22-Feb-1981, 40 y.o.   MRN: [id number]  HPI He has had pain in the right knee about a week ago.  He had some swelling and not feeling right.  He had no giving way, no redness.  He went to [place].  I have reviewed the notes and X-rays.  He was given prednisone and is much improved today.  He has no pain, no swelling, no redness.  He is walking well.  I have independently reviewed and interpreted x-rays of this patient done at another site by another physician or qualified health professional.     Review of Systems  Constitutional: Positive for activity change.  Musculoskeletal: Positive for arthralgias, gait problem and joint swelling.  All other systems reviewed and are negative.  For Review of Systems, all other systems reviewed and are negative.  The following is a summary of the past history medically, past history surgically, known current medicines, social history and family history.  This information is gathered electronically by the computer from prior information and documentation.  I review this each visit and have found including this information at this point in the chart is beneficial and informative.   Past Medical History:  Diagnosis Date  . Hyperlipidemia   . Status post placement of implantable loop recorder 2014   Medtronic Reveal LINQ - implanted in [place]    Past Surgical History:  Procedure Laterality Date  . Implantable loop recorder  2014  . varicose veins removed   2012   bilateral legs     Current Outpatient Medications on File Prior to Visit  Medication Sig Dispense Refill  . predniSONE (DELTASONE) 20 MG tablet 2 po at sametime daily for 5 days- start tomorrow (Patient not taking: Reported on 08/07/2020) 10 tablet 0   No current facility-administered medications on file prior to visit.    Social History   Socioeconomic History  . Marital status: Married    Spouse name: Not on  file  . Number of children: 2  . Years of education: Not on file  . Highest education level: Not on file  Occupational History  . Not on file  Tobacco Use  . Smoking status: Former Smoker    Packs/day: 0.50    Years: 20.00    Pack years: 10.00    Start date: 08/04/1992    Quit date: 12/2019    Years since quitting: 0.6  . Smokeless tobacco: Never Used  Vaping Use  . Vaping Use: Never used  Substance and Sexual Activity  . Alcohol use: Yes    Comment: occasional  . Drug use: No  . Sexual activity: Yes    Birth control/protection: Surgical  Other Topics Concern  . Not on file  Social History Narrative   Lives at home with wife and children   Right handed   Caffeine: coffee, approx 18 oz/day    Social Determinants of Health   Financial Resource Strain: Not on file  Food Insecurity: Not on file  Transportation Needs: Not on file  Physical Activity: Not on file  Stress: Not on file  Social Connections: Not on file  Intimate Partner Violence: Not on file    Family History  Problem Relation Age of Onset  . Hypertension Mother   . Varicose Veins Mother   . Meniere's disease Mother   . Migraines Mother        used to get them alot more, also  had vertigo   . Alcoholism Paternal Grandfather     BP (!) 134/93   Pulse (!) 58   Ht 6' (1.829 m)   Wt 263 lb 8 oz (119.5 kg)   BMI 35.74 kg/m   Body mass index is 35.74 kg/m.      Objective:   Physical Exam Vitals and nursing note reviewed. Exam conducted with a chaperone present.  Constitutional:      Appearance: He is well-developed.  HENT:     Head: Normocephalic and atraumatic.  Eyes:     Conjunctiva/sclera: Conjunctivae normal.     Pupils: Pupils are equal, round, and reactive to light.  Cardiovascular:     Rate and Rhythm: Normal rate and regular rhythm.  Pulmonary:     Effort: Pulmonary effort is normal.  Abdominal:     Palpations: Abdomen is soft.  Musculoskeletal:     Cervical back: Normal range of  motion and neck supple.       Legs:  Skin:    General: Skin is warm and dry.  Neurological:     Mental Status: He is alert and oriented to person, place, and time.     Cranial Nerves: No cranial nerve deficit.     Motor: No abnormal muscle tone.     Coordination: Coordination normal.     Deep Tendon Reflexes: Reflexes are normal and symmetric. Reflexes normal.  Psychiatric:        Behavior: Behavior normal.        Thought Content: Thought content normal.        Judgment: Judgment normal.           Assessment & Plan:   Encounter Diagnosis  Name Primary?  . Acute pain of right knee Yes   I will have him begin Naprosyn 500 po pc bid now that his dose of prednisone has ended.  Take this for the next three weeks.  I will see in three weeks.  He may need MRI if symptoms return.  If he continues to do well, then call and cancel.  Electronically Signed Sanjuana Kava, MD 3/29/202210:31 AM

## 2020-08-07 NOTE — Telephone Encounter (Signed)
Sent referral to hematology

## 2020-08-14 ENCOUNTER — Encounter: Payer: Self-pay | Admitting: Orthopaedic Surgery

## 2020-08-14 ENCOUNTER — Telehealth: Payer: Self-pay | Admitting: Orthopaedic Surgery

## 2020-08-14 ENCOUNTER — Telehealth: Payer: Self-pay

## 2020-08-14 NOTE — Telephone Encounter (Signed)
Patient called to relay that his right knee is not quite better yet; states some days it is very swollen, and some days it is not; therefore, he is unable to work due to nature of his job, which is very physical. Patient is asking for a note for intermittent out of work; states he is applying for the form. Please advise.

## 2020-08-14 NOTE — Telephone Encounter (Signed)
ok 

## 2020-08-14 NOTE — Telephone Encounter (Signed)
Called patient; aware. 

## 2020-08-15 ENCOUNTER — Ambulatory Visit: Payer: BC Managed Care – PPO | Admitting: Neurology

## 2020-08-17 DIAGNOSIS — M25561 Pain in right knee: Secondary | ICD-10-CM | POA: Diagnosis not present

## 2020-08-21 ENCOUNTER — Telehealth: Payer: Self-pay | Admitting: Orthopaedic Surgery

## 2020-08-21 ENCOUNTER — Telehealth: Payer: Self-pay | Admitting: Family Medicine

## 2020-08-21 NOTE — Telephone Encounter (Signed)
Patient called to inquire as to whether he may "go ahead with an MRI" due to symptoms of knee seeming to be getting worse.  Aware of his next scheduled appointment with Dr Luna Glasgow on 08/30/20. Please advise.

## 2020-08-21 NOTE — Telephone Encounter (Signed)
Try to schedule and see if they will do it.

## 2020-08-22 ENCOUNTER — Encounter: Payer: Self-pay | Admitting: Family Medicine

## 2020-08-22 ENCOUNTER — Ambulatory Visit (INDEPENDENT_AMBULATORY_CARE_PROVIDER_SITE_OTHER): Payer: BC Managed Care – PPO | Admitting: Family Medicine

## 2020-08-22 DIAGNOSIS — Z95818 Presence of other cardiac implants and grafts: Secondary | ICD-10-CM

## 2020-08-22 NOTE — Progress Notes (Signed)
   Virtual Visit via telephone Note  I connected with Ryan Conner on 08/22/20 at 1034 by telephone and verified that I am speaking with the correct person using two identifiers. Susumu Storrs is currently located at home and patient are currently with her during visit. The provider, Fransisca Kaufmann Jenae Tomasello, MD is located in their office at time of visit.  Call ended at 1041  I discussed the limitations, risks, security and privacy concerns of performing an evaluation and management service by telephone and the availability of in person appointments. I also discussed with the patient that there may be a patient responsible charge related to this service. The patient expressed understanding and agreed to proceed.   History and Present Illness: Patient is still has a loop recorder and wants to get it removed.  He has the loop recorder.  He was supposed to have it removed many years ago.  He missed the appt.   1. Status post placement of implantable loop recorder     Outpatient Encounter Medications as of 08/22/2020  Medication Sig  . propranolol (INDERAL) 10 MG tablet Take 10 mg by mouth 3 (three) times daily.  . naproxen (NAPROSYN) 500 MG tablet Take 1 tablet (500 mg total) by mouth 2 (two) times daily with a meal.  . [DISCONTINUED] predniSONE (DELTASONE) 20 MG tablet 2 po at sametime daily for 5 days- start tomorrow (Patient not taking: Reported on 08/07/2020)   No facility-administered encounter medications on file as of 08/22/2020.    Review of Systems  Constitutional: Negative for chills and fever.  Respiratory: Negative for shortness of breath and wheezing.   Cardiovascular: Negative for chest pain, palpitations and leg swelling.  Musculoskeletal: Negative for back pain and gait problem.  Skin: Negative for rash.  All other systems reviewed and are negative.   Observations/Objective: Patient sounds comfortable and in no acute distress  Assessment and Plan: Problem List Items  Addressed This Visit   None   Visit Diagnoses    Status post placement of implantable loop recorder    -  Primary   Relevant Orders   Ambulatory referral to Cardiology      Patient wants loop recorder removed, is been there for quite many years and they encouraged him to get it out Follow up plan: Return if symptoms worsen or fail to improve.     I discussed the assessment and treatment plan with the patient. The patient was provided an opportunity to ask questions and all were answered. The patient agreed with the plan and demonstrated an understanding of the instructions.   The patient was advised to call back or seek an in-person evaluation if the symptoms worsen or if the condition fails to improve as anticipated.  The above assessment and management plan was discussed with the patient. The patient verbalized understanding of and has agreed to the management plan. Patient is aware to call the clinic if symptoms persist or worsen. Patient is aware when to return to the clinic for a follow-up visit. Patient educated on when it is appropriate to go to the emergency department.    I provided 7 minutes of non-face-to-face time during this encounter.    Worthy Rancher, MD

## 2020-08-22 NOTE — Telephone Encounter (Signed)
Per Cardiology patient will require a referral from his PCP. Patient has not been in the office to speak with Dr. Warrick Parisian since 2018. Pt will need an appt before the referral can be placed. Pt is scheduled today for a televisit with Dr. Warrick Parisian.

## 2020-08-23 ENCOUNTER — Telehealth: Payer: Self-pay | Admitting: Orthopaedic Surgery

## 2020-08-23 DIAGNOSIS — M25561 Pain in right knee: Secondary | ICD-10-CM | POA: Diagnosis not present

## 2020-08-23 NOTE — Telephone Encounter (Signed)
This patient reports that he had FMLA completed for this office and he does not want the office to process the paperwork. FYI.

## 2020-08-23 NOTE — Telephone Encounter (Signed)
I called the patient to follow up and see what type of loop recorder that was seen on file. The patient reports that he is going to be seen by another provider and does not need the services of the office.   I advised him that someone had been trying to reach out to him about some questions with the model number and the make of the loop recorder and per him its a [employer] and the Model number is [id number].  The patient reports that our office took too long to respond back to him and he no longer wants services from this office.   Inez Catalina had tried a few minutes prior to my call to get in touch with the patient and the phone would ring and did not have an answering machine. The patient hung the phone up abruptly.   He reports that he has went to the other facility and he did not disclose the other facility that was being used and he reports that he was getting an MRI that did not need authorization. The patient no longer wants our services.

## 2020-08-27 NOTE — Telephone Encounter (Signed)
For your info.

## 2020-08-27 NOTE — Telephone Encounter (Signed)
Noted *(as stated in telephone note, I had been unable to leave a voice message when attempting to re-relay forms process. Will shred now that patient states he will not be returning and will not need.

## 2020-08-28 DIAGNOSIS — M25561 Pain in right knee: Secondary | ICD-10-CM | POA: Diagnosis not present

## 2020-08-30 ENCOUNTER — Ambulatory Visit: Payer: BC Managed Care – PPO | Admitting: Orthopaedic Surgery

## 2020-09-10 DIAGNOSIS — M7041 Prepatellar bursitis, right knee: Secondary | ICD-10-CM | POA: Diagnosis not present

## 2020-09-10 DIAGNOSIS — M71861 Other specified bursopathies, right knee: Secondary | ICD-10-CM | POA: Diagnosis not present

## 2020-09-10 DIAGNOSIS — G8918 Other acute postprocedural pain: Secondary | ICD-10-CM | POA: Diagnosis not present

## 2020-09-18 DIAGNOSIS — M7041 Prepatellar bursitis, right knee: Secondary | ICD-10-CM | POA: Diagnosis not present

## 2020-09-21 ENCOUNTER — Other Ambulatory Visit: Payer: Self-pay

## 2020-09-21 ENCOUNTER — Encounter: Payer: Self-pay | Admitting: Urology

## 2020-09-21 ENCOUNTER — Ambulatory Visit (HOSPITAL_COMMUNITY)
Admission: RE | Admit: 2020-09-21 | Discharge: 2020-09-21 | Disposition: A | Discharge status: Home / Self Care | Payer: BC Managed Care – PPO | Source: Ambulatory Visit | Attending: Urology | Admitting: Urology

## 2020-09-21 ENCOUNTER — Ambulatory Visit (INDEPENDENT_AMBULATORY_CARE_PROVIDER_SITE_OTHER): Payer: BC Managed Care – PPO | Admitting: Urology

## 2020-09-21 VITALS — BP 109/80 | HR 71 | Temp 98.5°F | Ht 72.0 in | Wt 260.0 lb

## 2020-09-21 DIAGNOSIS — R310 Gross hematuria: Secondary | ICD-10-CM | POA: Diagnosis not present

## 2020-09-21 DIAGNOSIS — Z87442 Personal history of urinary calculi: Secondary | ICD-10-CM | POA: Diagnosis not present

## 2020-09-21 LAB — MICROSCOPIC EXAMINATION
Epithelial Cells (non renal): NONE SEEN /hpf (ref 0–10)
Renal Epithel, UA: NONE SEEN /hpf
WBC, UA: NONE SEEN /hpf (ref 0–5)

## 2020-09-21 LAB — URINALYSIS, ROUTINE W REFLEX MICROSCOPIC
Bilirubin, UA: NEGATIVE
Glucose, UA: NEGATIVE
Leukocytes,UA: NEGATIVE
Nitrite, UA: NEGATIVE
Protein,UA: NEGATIVE
Specific Gravity, UA: >1.03 — ABNORMAL HIGH (ref 1.005–1.030)
Urobilinogen, Ur: 0.2 mg/dL (ref 0.2–1.0)
pH, UA: 5.5 (ref 5.0–7.5)

## 2020-09-21 MED ORDER — IOHEXOL 300 MG/ML  SOLN
100.0000 mL | Freq: Once | INTRAMUSCULAR | Status: AC | PRN
  Administered 2020-09-21: 100 mL via INTRAVENOUS

## 2020-09-21 NOTE — Progress Notes (Signed)
09/21/2020 2:29 PM   Ryan Conner Oct 30, 1980 [id number]  Referring provider: Dettinger, Fransisca Kaufmann, MD [place],  [place] [address]  Gross hematuria  HPI: Ryan Conner is a 40yo here for evaluation of gross hematuria. Starting 6-8 months he developed gross hematuria with clots. At that time he was having dysuria, urinary urgency at the time of the hematuria. No hx of prior UTI. No hx of prostatitis. He has a 30pk year smoking hx. CT stone study 3/7 showed no calculi.  He has a hx of nephrolithiasis.    PMH: Past Medical History:  Diagnosis Date  . Arrhythmia   . Deep vein thrombosis (DVT) ([place])   . Hyperlipidemia   . Status post placement of implantable loop recorder 2014   Medtronic Reveal LINQ - implanted in [place]    Surgical History: Past Surgical History:  Procedure Laterality Date  . Implantable loop recorder  2014  . varicose veins removed   2012   bilateral legs     Home Medications:  Allergies as of 09/21/2020   No Known Allergies     Medication List       Accurate as of Sep 21, 2020  2:29 PM. If you have any questions, ask your nurse or doctor.        Acetaminophen Extra Strength 500 MG tablet Generic drug: acetaminophen Take 1,000 mg by mouth every 8 (eight) hours as needed.   aspirin 81 MG chewable tablet Chew 81 mg by mouth 2 (two) times daily.   diclofenac 75 MG EC tablet Commonly known as: VOLTAREN Take 75 mg by mouth 2 (two) times daily.   naproxen 500 MG tablet Commonly known as: NAPROSYN Take 1 tablet (500 mg total) by mouth 2 (two) times daily with a meal.   ondansetron 4 MG tablet Commonly known as: ZOFRAN Take 4 mg by mouth every 6 (six) hours as needed.   oxyCODONE 5 MG immediate release tablet Commonly known as: Oxy IR/ROXICODONE Take 5 mg by mouth every 6 (six) hours as needed.   propranolol 10 MG tablet Commonly known as: INDERAL Take 10 mg by mouth 3 (three) times daily.       Allergies: No Known  Allergies  Family History: Family History  Problem Relation Age of Onset  . Hypertension Mother   . Varicose Veins Mother   . Meniere's disease Mother   . Migraines Mother        used to get them alot more, also had vertigo   . Alcoholism Paternal Grandfather     Social History:  reports that he quit smoking about 9 months ago. He started smoking about 28 years ago. He has a 12.50 pack-year smoking history. He has never used smokeless tobacco. He reports current alcohol use. He reports that he does not use drugs.  ROS: All other review of systems were reviewed and are negative except what is noted above in HPI  Physical Exam: BP 109/80   Pulse 71   Temp 98.5 F (36.9 C)   Ht 6' (1.829 m)   Wt 260 lb (117.9 kg)   BMI 35.26 kg/m   Constitutional:  Alert and oriented, No acute distress. HEENT: [place] AT, moist mucus membranes.  Trachea midline, no masses. Cardiovascular: No clubbing, cyanosis, or edema. Respiratory: Normal respiratory effort, no increased work of breathing. GI: Abdomen is soft, nontender, nondistended, no abdominal masses GU: No CVA tenderness.  Lymph: No cervical or inguinal lymphadenopathy. Skin: No rashes, bruises or suspicious lesions.  Neurologic: Grossly intact, no focal deficits, moving all 4 extremities. Psychiatric: Normal mood and affect.  Laboratory Data: Lab Results  Component Value Date   WBC 6.5 06/19/2020   HGB 15.6 06/19/2020   HCT 44.8 06/19/2020   MCV 83 06/19/2020   PLT 259 06/19/2020    Lab Results  Component Value Date   CREATININE 0.99 06/19/2020    No results found for: PSA  No results found for: TESTOSTERONE  No results found for: HGBA1C  Urinalysis    Component Value Date/Time   COLORURINE YELLOW 01/23/2019 1030   APPEARANCEUR Clear 06/19/2020 1017   LABSPEC 1.019 01/23/2019 1030   PHURINE 5.0 01/23/2019 1030   GLUCOSEU Negative 06/19/2020 1017   HGBUR LARGE (A) 01/23/2019 1030   BILIRUBINUR Negative 06/19/2020 [place] 01/23/2019 1030   PROTEINUR Negative 06/19/2020 [place] 01/23/2019 1030   NITRITE Negative 06/19/2020 1017   NITRITE NEGATIVE 01/23/2019 1030   LEUKOCYTESUR Negative 06/19/2020 1017   LEUKOCYTESUR NEGATIVE 01/23/2019 1030    Lab Results  Component Value Date   LABMICR See below: 06/19/2020   WBCUA 6-10 (A) 06/19/2020   LABEPIT None seen 06/19/2020   MUCUS Present (A) 06/19/2020   BACTERIA Few 06/19/2020    Pertinent Imaging: CT 07/16/2020: Images reviewed and discussed with the patient No results found for this or any previous visit.  No results found for this or any previous visit.  No results found for this or any previous visit.  No results found for this or any previous visit.  No results found for this or any previous visit.  No results found for this or any previous visit.  No results found for this or any previous visit.  Results for orders placed during the hospital encounter of 01/23/19  CT Renal Stone Study  Narrative CLINICAL DATA:  Hematuria. Flank pain. History of kidney stones.  EXAM: CT ABDOMEN AND PELVIS WITHOUT CONTRAST  TECHNIQUE: Multidetector CT imaging of the abdomen and pelvis was performed following the standard protocol without IV contrast.  COMPARISON:  None.  FINDINGS: Lower chest: Normal.  Hepatobiliary: No focal liver abnormality is seen. No gallstones, gallbladder wall thickening, or biliary dilatation.  Pancreas: Unremarkable. No pancreatic ductal dilatation or surrounding inflammatory changes.  Spleen: Normal in size without focal abnormality.  Adrenals/Urinary Tract: Adrenal glands are unremarkable. Kidneys are normal, without renal calculi, focal lesion, or hydronephrosis. Bladder is unremarkable.  Stomach/Bowel: Stomach is within normal limits. Appendix appears normal. No evidence of bowel wall thickening, distention, or inflammatory changes.  Vascular/Lymphatic: No significant  vascular findings are present. No enlarged abdominal or pelvic lymph nodes.  Reproductive: Prostate is unremarkable.  Other: No abdominal wall hernia or abnormality. No abdominopelvic ascites.  Musculoskeletal: No acute or significant osseous findings.  IMPRESSION: Benign-appearing abdomen and pelvis.   Electronically Signed By: Lorriane Shire M.D. On: 01/23/2019 12:18   Assessment & Plan:    1. Gross hematuria BMP -CT hematuria -office cystoscopy - Urinalysis, Routine w reflex microscopic - BLADDER SCAN AMB NON-IMAGING   No follow-ups on file.  Nicolette Bang, MD  [facility] [place]

## 2020-09-21 NOTE — Progress Notes (Signed)
post void residual= 4  Urological Symptom Review  Patient is experiencing the following symptoms: Frequent urination Burning/pain with urination Get up at night to urinate Blood in urine   Review of Systems  Gastrointestinal (upper)  : Negative for upper GI symptoms  Gastrointestinal (lower) : Negative for lower GI symptoms  Constitutional : Negative for symptoms  Skin: Negative for skin symptoms  Eyes: Negative for eye symptoms  Ear/Nose/Throat : Negative for Ear/Nose/Throat symptoms  Hematologic/Lymphatic: Negative for Hematologic/Lymphatic symptoms  Cardiovascular : Leg swelling  Respiratory : Negative for respiratory symptoms  Endocrine: Negative for endocrine symptoms  Musculoskeletal: Negative for musculoskeletal symptoms  Neurological: Negative for neurological symptoms  Psychologic: Negative for psychiatric symptoms

## 2020-09-21 NOTE — Patient Instructions (Signed)

## 2020-09-21 NOTE — H&P (View-Only) (Signed)
09/21/2020 2:29 PM   Ryan Conner Oct 30, 1980 [id number]  Referring provider: Dettinger, Ryan Kaufmann, MD [place],  [place] [address]  Gross hematuria  HPI: Ryan Conner is a 40yo here for evaluation of gross hematuria. Starting 6-8 months he developed gross hematuria with clots. At that time he was having dysuria, urinary urgency at the time of the hematuria. No hx of prior UTI. No hx of prostatitis. He has a 30pk year smoking hx. CT stone study 3/7 showed no calculi.  He has a hx of nephrolithiasis.    PMH: Past Medical History:  Diagnosis Date  . Arrhythmia   . Deep vein thrombosis (DVT) ([place])   . Hyperlipidemia   . Status post placement of implantable loop recorder 2014   Medtronic Reveal LINQ - implanted in [place]    Surgical History: Past Surgical History:  Procedure Laterality Date  . Implantable loop recorder  2014  . varicose veins removed   2012   bilateral legs     Home Medications:  Allergies as of 09/21/2020   No Known Allergies     Medication List       Accurate as of Sep 21, 2020  2:29 PM. If you have any questions, ask your nurse or doctor.        Acetaminophen Extra Strength 500 MG tablet Generic drug: acetaminophen Take 1,000 mg by mouth every 8 (eight) hours as needed.   aspirin 81 MG chewable tablet Chew 81 mg by mouth 2 (two) times daily.   diclofenac 75 MG EC tablet Commonly known as: VOLTAREN Take 75 mg by mouth 2 (two) times daily.   naproxen 500 MG tablet Commonly known as: NAPROSYN Take 1 tablet (500 mg total) by mouth 2 (two) times daily with a meal.   ondansetron 4 MG tablet Commonly known as: ZOFRAN Take 4 mg by mouth every 6 (six) hours as needed.   oxyCODONE 5 MG immediate release tablet Commonly known as: Oxy IR/ROXICODONE Take 5 mg by mouth every 6 (six) hours as needed.   propranolol 10 MG tablet Commonly known as: INDERAL Take 10 mg by mouth 3 (three) times daily.       Allergies: No Known  Allergies  Family History: Family History  Problem Relation Age of Onset  . Hypertension Mother   . Varicose Veins Mother   . Meniere's disease Mother   . Migraines Mother        used to get them alot more, also had vertigo   . Alcoholism Paternal Grandfather     Social History:  reports that he quit smoking about 9 months ago. He started smoking about 28 years ago. He has a 12.50 pack-year smoking history. He has never used smokeless tobacco. He reports current alcohol use. He reports that he does not use drugs.  ROS: All other review of systems were reviewed and are negative except what is noted above in HPI  Physical Exam: BP 109/80   Pulse 71   Temp 98.5 F (36.9 C)   Ht 6' (1.829 m)   Wt 260 lb (117.9 kg)   BMI 35.26 kg/m   Constitutional:  Alert and oriented, No acute distress. HEENT: [place] AT, moist mucus membranes.  Trachea midline, no masses. Cardiovascular: No clubbing, cyanosis, or edema. Respiratory: Normal respiratory effort, no increased work of breathing. GI: Abdomen is soft, nontender, nondistended, no abdominal masses GU: No CVA tenderness.  Lymph: No cervical or inguinal lymphadenopathy. Skin: No rashes, bruises or suspicious lesions.  Neurologic: Grossly intact, no focal deficits, moving all 4 extremities. Psychiatric: Normal mood and affect.  Laboratory Data: Lab Results  Component Value Date   WBC 6.5 06/19/2020   HGB 15.6 06/19/2020   HCT 44.8 06/19/2020   MCV 83 06/19/2020   PLT 259 06/19/2020    Lab Results  Component Value Date   CREATININE 0.99 06/19/2020    No results found for: PSA  No results found for: TESTOSTERONE  No results found for: HGBA1C  Urinalysis    Component Value Date/Time   COLORURINE YELLOW 01/23/2019 1030   APPEARANCEUR Clear 06/19/2020 1017   LABSPEC 1.019 01/23/2019 1030   PHURINE 5.0 01/23/2019 1030   GLUCOSEU Negative 06/19/2020 1017   HGBUR LARGE (A) 01/23/2019 1030   BILIRUBINUR Negative 06/19/2020 [place] 01/23/2019 1030   PROTEINUR Negative 06/19/2020 [place] 01/23/2019 1030   NITRITE Negative 06/19/2020 1017   NITRITE NEGATIVE 01/23/2019 1030   LEUKOCYTESUR Negative 06/19/2020 1017   LEUKOCYTESUR NEGATIVE 01/23/2019 1030    Lab Results  Component Value Date   LABMICR See below: 06/19/2020   WBCUA 6-10 (A) 06/19/2020   LABEPIT None seen 06/19/2020   MUCUS Present (A) 06/19/2020   BACTERIA Few 06/19/2020    Pertinent Imaging: CT 07/16/2020: Images reviewed and discussed with the patient No results found for this or any previous visit.  No results found for this or any previous visit.  No results found for this or any previous visit.  No results found for this or any previous visit.  No results found for this or any previous visit.  No results found for this or any previous visit.  No results found for this or any previous visit.  Results for orders placed during the hospital encounter of 01/23/19  CT Renal Stone Study  Narrative CLINICAL DATA:  Hematuria. Flank pain. History of kidney stones.  EXAM: CT ABDOMEN AND PELVIS WITHOUT CONTRAST  TECHNIQUE: Multidetector CT imaging of the abdomen and pelvis was performed following the standard protocol without IV contrast.  COMPARISON:  None.  FINDINGS: Lower chest: Normal.  Hepatobiliary: No focal liver abnormality is seen. No gallstones, gallbladder wall thickening, or biliary dilatation.  Pancreas: Unremarkable. No pancreatic ductal dilatation or surrounding inflammatory changes.  Spleen: Normal in size without focal abnormality.  Adrenals/Urinary Tract: Adrenal glands are unremarkable. Kidneys are normal, without renal calculi, focal lesion, or hydronephrosis. Bladder is unremarkable.  Stomach/Bowel: Stomach is within normal limits. Appendix appears normal. No evidence of bowel wall thickening, distention, or inflammatory changes.  Vascular/Lymphatic: No significant  vascular findings are present. No enlarged abdominal or pelvic lymph nodes.  Reproductive: Prostate is unremarkable.  Other: No abdominal wall hernia or abnormality. No abdominopelvic ascites.  Musculoskeletal: No acute or significant osseous findings.  IMPRESSION: Benign-appearing abdomen and pelvis.   Electronically Signed By: Ryan Conner M.D. On: 01/23/2019 12:18   Assessment & Plan:    1. Gross hematuria BMP -CT hematuria -office cystoscopy - Urinalysis, Routine w reflex microscopic - BLADDER SCAN AMB NON-IMAGING   No follow-ups on file.  Nicolette Bang, MD  [facility] [place]

## 2020-09-22 LAB — BASIC METABOLIC PANEL
BUN/Creatinine Ratio: 26 — ABNORMAL HIGH (ref 9–20)
BUN: 23 mg/dL — ABNORMAL HIGH (ref 6–20)
CO2: 22 mmol/L (ref 20–29)
Calcium: 9.3 mg/dL (ref 8.7–10.2)
Chloride: 99 mmol/L (ref 96–106)
Creatinine, Ser: 0.89 mg/dL (ref 0.76–1.27)
Glucose: 88 mg/dL (ref 65–99)
Potassium: 4.8 mmol/L (ref 3.5–5.2)
Sodium: 140 mmol/L (ref 134–144)
eGFR: 112 mL/min/{1.73_m2} (ref >59)

## 2020-09-25 ENCOUNTER — Encounter: Payer: Self-pay | Admitting: Internal Medicine

## 2020-09-25 ENCOUNTER — Other Ambulatory Visit: Payer: Self-pay

## 2020-09-25 ENCOUNTER — Ambulatory Visit (INDEPENDENT_AMBULATORY_CARE_PROVIDER_SITE_OTHER): Payer: BC Managed Care – PPO | Admitting: Internal Medicine

## 2020-09-25 DIAGNOSIS — Z95818 Presence of other cardiac implants and grafts: Secondary | ICD-10-CM | POA: Diagnosis not present

## 2020-09-25 NOTE — Patient Instructions (Addendum)
Medication Instructions:  Your physician recommends that you continue on your current medications as directed. Please refer to the Current Medication list given to you today.  Labwork: None ordered.  Testing/Procedures: None ordered.  Follow-Up:  Your physician wants you to follow-up in: as needed with Dr. Taylor   Implantable Loop Recorder Removal, Care After This sheet gives you information about how to care for yourself after your procedure. Your health care provider may also give you more specific instructions. If you have problems or questions, contact your health care provider. What can I expect after the procedure? After the procedure, it is common to have:  Soreness or discomfort near the incision.  Some swelling or bruising near the incision.  Follow these instructions at home: Incision care  1.  Leave your outer dressing on for 72 hours.  After 72 hours you can remove your outer dressing and shower. 2. Leave adhesive strips in place. These skin closures may need to stay in place for 1-2 weeks. If adhesive strip edges start to loosen and curl up, you may trim the loose edges.  You may remove the strips if they have not fallen off after 2 weeks. 3. Check your incision area every day for signs of infection. Check for: a. Redness, swelling, or pain. b. Fluid or blood. c. Warmth. d. Pus or a bad smell. 4. Do not take baths, swim, or use a hot tub until your incision is completely healed. 5. If your wound site starts to bleed apply pressure.      If you have any questions/concerns please call the device clinic at [phone].  Activity  Return to your normal activities.  Contact a health care provider if:  You have redness, swelling, or pain around your incision.  You have a fever.  

## 2020-09-25 NOTE — Progress Notes (Signed)
HPI Mr. Gentle is referred today for consideration for ILR removal. He is a pleasant 40 yo man with a h/o SVT s/p remote ablation. He is s/p ILR insertion. He had an ILR inserted in 2014. He would like to have it removed. He is at RRT. He does not have much in the way of palpitations. He denies chest pain or sob. He also has dyslipidemia. He is an active smoker.  No Known Allergies   Current Outpatient Medications  Medication Sig Dispense Refill  . ACETAMINOPHEN EXTRA STRENGTH 500 MG tablet Take 1,000 mg by mouth every 8 (eight) hours as needed.    Marland Kitchen aspirin 81 MG chewable tablet Chew 81 mg by mouth 2 (two) times daily.    . diclofenac (VOLTAREN) 75 MG EC tablet Take 75 mg by mouth 2 (two) times daily.    . naproxen (NAPROSYN) 500 MG tablet Take 1 tablet (500 mg total) by mouth 2 (two) times daily with a meal. 60 tablet 5  . ondansetron (ZOFRAN) 4 MG tablet Take 4 mg by mouth every 6 (six) hours as needed.    Marland Kitchen oxyCODONE (OXY IR/ROXICODONE) 5 MG immediate release tablet Take 5 mg by mouth every 6 (six) hours as needed.    . propranolol (INDERAL) 10 MG tablet Take 10 mg by mouth 3 (three) times daily.     No current facility-administered medications for this visit.     Past Medical History:  Diagnosis Date  . Arrhythmia   . Deep vein thrombosis (DVT) ([place])   . Hyperlipidemia   . Status post placement of implantable loop recorder 2014   Medtronic Reveal LINQ - implanted in [place]    ROS:   All systems reviewed and negative except as noted in the HPI.   Past Surgical History:  Procedure Laterality Date  . Implantable loop recorder  2014  . varicose veins removed   2012   bilateral legs      Family History  Problem Relation Age of Onset  . Hypertension Mother   . Varicose Veins Mother   . Meniere's disease Mother   . Migraines Mother        used to get them alot more, also had vertigo   . Alcoholism Paternal Grandfather      Social History   Socioeconomic  History  . Marital status: Married    Spouse name: Not on file  . Number of children: 2  . Years of education: Not on file  . Highest education level: Not on file  Occupational History  . Occupation: feild Engineer, production: [employer]  Tobacco Use  . Smoking status: Former Smoker    Packs/day: 0.50    Years: 25.00    Pack years: 12.50    Start date: 08/04/1992    Quit date: 12/2019    Years since quitting: 0.7  . Smokeless tobacco: Never Used  Vaping Use  . Vaping Use: Never used  Substance and Sexual Activity  . Alcohol use: Yes    Comment: occasional  . Drug use: No  . Sexual activity: Yes    Birth control/protection: Surgical  Other Topics Concern  . Not on file  Social History Narrative   Lives at home with wife and children   Right handed   Caffeine: coffee, approx 18 oz/day    Social Determinants of Health   Financial Resource Strain: Not on file  Food Insecurity: Not on file  Transportation Needs: Not on file  Physical Activity: Not on file  Stress: Not on file  Social Connections: Not on file  Intimate Partner Violence: Not on file     BP 112/80   Pulse 62   Ht 6' (1.829 m)   Wt 262 lb 6.4 oz (119 kg)   SpO2 99%   BMI 35.59 kg/m   Physical Exam:  Well appearing 40 yo man, NAD HEENT: Unremarkable Neck:  6 cm JVD, no thyromegally Lymphatics:  No adenopathy Back:  No CVA tenderness Lungs:  Clear with no wheezes HEART:  Regular rate rhythm, no murmurs, no rubs, no clicks Abd:  soft, positive bowel sounds, no organomegally, no rebound, no guarding Ext:  2 plus pulses, no edema, no cyanosis, no clubbing Skin:  No rashes no nodules Neuro:  CN II through XII intact, motor grossly intact   Assess/Plan: 1. ILR - his device is beyond RRT. I have recommended he undergo removal. The risks/benefits/goals/expectations of ILR removal were discussed and he wishes to proceed. 2. Dyslipidemia - he will continue his statin. 3. Tobacco abuse - encouraged to stop  smoking. 4. SVT - he is s/p ablation remotely with no sustained SVT.  EP Procedure Note  Procedure performed: ILR removal  Preoperative diagnosis: ILR at RRT  Postoperative diagnosis: same as preoperative diagnosis  Procedure Performed: after informed consent was obtained, the patient was prepped and draped in a sterile fashion. 10 cc of lidocaine was infiltrated. A one cm stab incision was carried out and a combination of blunt and sharp dissection was used. The ILR was grasped. The scar tissue was dissected away and the ILR was removed with gentle traction. Benzoin and steristrips were painted on the skin. A bandage was applied. The patient was recovered in the usual manner.   Carleene Overlie Cordie Beazley,MD

## 2020-10-10 ENCOUNTER — Ambulatory Visit (INDEPENDENT_AMBULATORY_CARE_PROVIDER_SITE_OTHER): Payer: BC Managed Care – PPO | Admitting: Urology

## 2020-10-10 ENCOUNTER — Encounter: Payer: Self-pay | Admitting: Urology

## 2020-10-10 ENCOUNTER — Other Ambulatory Visit: Payer: Self-pay

## 2020-10-10 VITALS — BP 109/80 | HR 76 | Ht 72.0 in

## 2020-10-10 DIAGNOSIS — R310 Gross hematuria: Secondary | ICD-10-CM

## 2020-10-10 LAB — URINALYSIS, ROUTINE W REFLEX MICROSCOPIC
Bilirubin, UA: NEGATIVE
Glucose, UA: NEGATIVE
Ketones, UA: NEGATIVE
Leukocytes,UA: NEGATIVE
Nitrite, UA: NEGATIVE
Protein,UA: NEGATIVE
RBC, UA: NEGATIVE
Specific Gravity, UA: 1.02 (ref 1.005–1.030)
Urobilinogen, Ur: 0.2 mg/dL (ref 0.2–1.0)
pH, UA: 6.5 (ref 5.0–7.5)

## 2020-10-10 MED ORDER — CIPROFLOXACIN HCL 500 MG PO TABS
500.0000 mg | ORAL_TABLET | Freq: Once | ORAL | Status: AC
  Administered 2020-10-10: 500 mg via ORAL

## 2020-10-10 NOTE — Progress Notes (Signed)
   10/10/20  CC: hematuria  HPI: Ryan Conner is a 40yo here for hematuria evaluation. 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 NED. A&Ox3.   No respiratory distress   Abd soft, NT, ND Normal phallus with bilateral descended testicles  Cystoscopy Procedure Note  Patient identification was confirmed, informed consent was obtained, and patient was prepped using Betadine solution.  Lidocaine jelly was administered per urethral meatus.     Pre-Procedure: - Inspection reveals a normal caliber ureteral meatus.  Procedure: The flexible cystoscope was introduced without difficulty - No urethral strictures/lesions are present. - Normal prostate  - Normal bladder neck - Bilateral ureteral orifices identified - 4cm papillary left lateral wall tumor - No bladder stones - No trabeculation  Retroflexion shows no intravesical prostatic protrusion   Post-Procedure: - Patient tolerated the procedure well  Assessment/ Plan: We discussed the management of bladder tumors including transurethral resection and after discussing the options the patient elects for bladder tumor resection. Risks/benefits/alternatives discussed   Nicolette Bang, MD

## 2020-10-10 NOTE — Patient Instructions (Signed)
Transurethral Resection of Bladder Tumor  Transurethral resection of a bladder tumor is the removal (resection) of a cancerous growth (tumor) on the inside wall of the bladder. The bladder is the organ that holds urine. The tumor is removed through the tube that carries urine out of the body (urethra). In a transurethral resection, a thin telescope with a light, a tiny camera, and an electric cutting edge (resectoscope) is passed through the urethra. In men, the opening of the urethra is at the end of the penis. In women, it is just above the opening of the vagina. Tell a health care provider about:  Any allergies you have.  All medicines you are taking, including vitamins, herbs, eye drops, creams, and over-the-counter medicines.  Any problems you or family members have had with anesthetic medicines.  Any blood disorders you have.  Any surgeries you have had.  Any medical conditions you have.  Any recent urinary tract infections you have had.  Whether you are pregnant or may be pregnant. What are the risks? Generally, this is a safe procedure. However, problems may occur, including:  Infection.  Bleeding.  Allergic reactions to medicines.  Damage to nearby structures or organs, such as: ? The urethra. ? The tubes that drain urine from the kidneys into the bladder (ureters).  Pain and burning during urination.  Difficulty urinating due to partial blockage of the urethra.  Inability to urinate (urinary retention). What happens before the procedure? Staying hydrated Follow instructions from your health care provider about hydration, which may include:  Up to 2 hours before the procedure - you may continue to drink clear liquids, such as water, clear fruit juice, black coffee, and plain tea.   Eating and drinking restrictions Follow instructions from your health care provider about eating and drinking, which may include:  8 hours before the procedure - stop eating heavy  meals or foods, such as meat, fried foods, or fatty foods.  6 hours before the procedure - stop eating light meals or foods, such as toast or cereal.  6 hours before the procedure - stop drinking milk or drinks that contain milk.  2 hours before the procedure - stop drinking clear liquids. Medicines Ask your health care provider about:  Changing or stopping your regular medicines. This is especially important if you are taking diabetes medicines or blood thinners.  Taking medicines such as aspirin and ibuprofen. These medicines can thin your blood. Do not take these medicines unless your health care provider tells you to take them.  Taking over-the-counter medicines, vitamins, herbs, and supplements. Tests You may have exams or tests, including:  Physical exam.  Blood tests.  Urine tests.  Electrocardiogram (ECG). This test measures the electrical activity of the heart. General instructions  Plan to have someone take you home from the hospital or clinic.  Ask your health care provider how your surgical site will be marked or identified.  Ask your health care provider what steps will be taken to help prevent infection. These may include: ? Washing skin with a germ-killing soap. ? Taking antibiotic medicine. What happens during the procedure?  An IV will be inserted into one of your veins.  You will be given one or more of the following: ? A medicine to help you relax (sedative). ? A medicine to make you fall asleep (general anesthetic). ? A medicine that is injected into your spine to numb the area below and slightly above the injection site (spinal anesthetic).  Your legs will   be placed in foot rests (stirrups) so that your legs are apart and your knees are bent.  The resectoscope will be passed through your urethra and into your bladder.  The part of your bladder that is affected by the tumor will be resected using the cutting edge of the resectoscope.  The  resectoscope will be removed.  A thin, flexible tube (catheter) will be passed through your urethra and into your bladder. The catheter will drain urine into a bag outside of your body. ? Fluid may be passed through the catheter to keep the catheter open. The procedure may vary among health care providers and hospitals. What happens after the procedure?  Your blood pressure, heart rate, breathing rate, and blood oxygen level will be monitored until you leave the hospital or clinic.  You may continue to receive fluids and medicines through an IV.  You will have some pain. You will be given pain medicine to relieve pain.  You will have a catheter to drain your urine. ? You will have blood in your urine. Your catheter may be kept in until your urine is clear. ? The amount of urine will be monitored. If necessary, your bladder may be rinsed out (irrigated) by passing fluid through your catheter.  You will be encouraged to walk around as soon as possible.  You may have to wear compression stockings. These stockings help to prevent blood clots and reduce swelling in your legs.  Do not drive for 24 hours if you were given a sedative during your procedure. Summary  Transurethral resection of a bladder tumor is the removal (resection) of a cancerous growth (tumor) on the inside wall of the bladder.  To do this procedure, your health care provider uses a thin telescope with a light, a tiny camera, and an electric cutting edge (resectoscope).  Follow your health care provider's instructions. You may need to stop or change certain medicines, and you may be told to stop eating and drinking several hours before the procedure.  Your blood pressure, heart rate, breathing rate, and blood oxygen level will be monitored until you leave the hospital or clinic.  You may have to wear compression stockings. These stockings help to prevent blood clots and reduce swelling in your legs. This information is  not intended to replace advice given to you by your health care provider. Make sure you discuss any questions you have with your health care provider. Document Revised: 11/27/2017 Document Reviewed: 11/27/2017 Elsevier Patient Education  2021 [employer] Inc.  

## 2020-10-10 NOTE — Patient Instructions (Signed)
Ryan Conner  10/10/2020     @PREFPERIOPPHARMACY @   Your procedure is scheduled on  10/15/2020.   Report to [facility] at  1300 (1:00)   P.M.   Call this number if you have problems the morning of surgery:  [phone]   Remember:  Do not eat or drink after midnight.                        Take these medicines the morning of surgery with A SIP OF WATER  Voltaren, zofran (if needed), oxycodone (if needed), propranolol.     Please brush your teeth.  Do not wear jewelry, make-up or nail polish.  Do not wear lotions, powders, or perfumes, or deodorant.  Do not shave 48 hours prior to surgery.  Men may shave face and neck.  Do not bring valuables to the hospital.  [facility] is not responsible for any belongings or valuables.  Contacts, dentures or bridgework may not be worn into surgery.  Leave your suitcase in the car.  After surgery it may be brought to your room.  For patients admitted to the hospital, discharge time will be determined by your treatment team.  Patients discharged the day of surgery will not be allowed to drive home and must have someone with them for 24 hours.    Special instructions:  DO NOT smoke tobacco or vape for 24 hours before your procedure.  Please read over the following fact sheets that you were given. Coughing and Deep Breathing, Surgical Site Infection Prevention, Anesthesia Post-op Instructions and Care and Recovery After Surgery       Transurethral Resection of Bladder Tumor, Care After This sheet gives you information about how to care for yourself after your procedure. Your health care provider may also give you more specific instructions. If you have problems or questions, contact your health care provider. What can I expect after the procedure? After the procedure, it is common to have:  A small amount of blood in your urine for up to 2 weeks.  Soreness or mild pain from your catheter. After your catheter is removed,  you may have mild soreness, especially when urinating.  Pain in your lower abdomen. Follow these instructions at home: Medicines  Take over-the-counter and prescription medicines only as told by your health care provider.  If you were prescribed an antibiotic medicine, take it as told by your health care provider. Do not stop taking the antibiotic even if you start to feel better.  Do not drive for 24 hours if you were given a sedative during your procedure.  Ask your health care provider if the medicine prescribed to you: ? Requires you to avoid driving or using heavy machinery. ? Can cause constipation. You may need to take these actions to prevent or treat constipation:  Take over-the-counter or prescription medicines.  Eat foods that are high in fiber, such as beans, whole grains, and fresh fruits and vegetables.  Limit foods that are high in fat and processed sugars, such as fried or sweet foods.   Activity  Return to your normal activities as told by your health care provider. Ask your health care provider what activities are safe for you.  Do not lift anything that is heavier than 10 lb (4.5 kg), or the limit that you are told, until your health care provider says that it is safe.  Avoid intense physical activity for as  long as told by your health care provider.  Rest as told by your health care provider.  Avoid sitting for a long time without moving. Get up to take short walks every 1-2 hours. This is important to improve blood flow and breathing. Ask for help if you feel weak or unsteady. General instructions  Do not drink alcohol for as long as told by your health care provider. This is especially important if you are taking prescription pain medicines.  Do not take baths, swim, or use a hot tub until your health care provider approves. Ask your health care provider if you may take showers. You may only be allowed to take sponge baths.  If you have a catheter, follow  instructions from your health care provider about caring for your catheter and your drainage bag.  Drink enough fluid to keep your urine pale yellow.  Wear compression stockings as told by your health care provider. These stockings help to prevent blood clots and reduce swelling in your legs.  Keep all follow-up visits as told by your health care provider. This is important. ? You will need to be followed closely with regular checks of your bladder and urethra (cystoscopies) to make sure that the cancer does not come back.   Contact a health care provider if:  You have pain that gets worse or does not improve with medicine.  You have blood in your urine for more than 2 weeks.  You have cloudy or bad-smelling urine.  You become constipated. Signs of constipation may include having: ? Fewer than three bowel movements in a week. ? Difficulty having a bowel movement. ? Stools that are dry, hard, or larger than normal.  You have a fever. Get help right away if:  You have: ? Severe pain. ? Bright red blood in your urine. ? Blood clots in your urine. ? A lot of blood in your urine.  Your catheter has been removed and you are not able to urinate.  You have a catheter in place and the catheter is not draining urine. Summary  After your procedure, it is common to have a small amount of blood in your urine, soreness or mild pain from your catheter, and pain in your lower abdomen.  Take over-the-counter and prescription medicines only as told by your health care provider.  Rest as told by your health care provider. Follow your health care provider's instructions about returning to normal activities. Ask what activities are safe for you.  If you have a catheter, follow instructions from your health care provider about caring for your catheter and your drainage bag.  Get help right away if you cannot urinate, you have severe pain, or you have bright red blood or blood clots in your  urine. This information is not intended to replace advice given to you by your health care provider. Make sure you discuss any questions you have with your health care provider. Document Revised: 11/26/2017 Document Reviewed: 11/26/2017 Elsevier Patient Education  2021 [place] Anesthesia, Adult, Care After This sheet gives you information about how to care for yourself after your procedure. Your health care provider may also give you more specific instructions. If you have problems or questions, contact your health care provider. What can I expect after the procedure? After the procedure, the following side effects are common:  Pain or discomfort at the IV site.  Nausea.  Vomiting.  Sore throat.  Trouble concentrating.  Feeling cold or chills.  Feeling  weak or tired.  Sleepiness and fatigue.  Soreness and body aches. These side effects can affect parts of the body that were not involved in surgery. Follow these instructions at home: For the time period you were told by your health care provider:  Rest.  Do not participate in activities where you could fall or become injured.  Do not drive or use machinery.  Do not drink alcohol.  Do not take sleeping pills or medicines that cause drowsiness.  Do not make important decisions or sign legal documents.  Do not take care of children on your own.   Eating and drinking  Follow any instructions from your health care provider about eating or drinking restrictions.  When you feel hungry, start by eating small amounts of foods that are soft and easy to digest (bland), such as toast. Gradually return to your regular diet.  Drink enough fluid to keep your urine pale yellow.  If you vomit, rehydrate by drinking water, juice, or clear broth. General instructions  If you have sleep apnea, surgery and certain medicines can increase your risk for breathing problems. Follow instructions from your health care  provider about wearing your sleep device: ? Anytime you are sleeping, including during daytime naps. ? While taking prescription pain medicines, sleeping medicines, or medicines that make you drowsy.  Have a responsible adult stay with you for the time you are told. It is important to have someone help care for you until you are awake and alert.  Return to your normal activities as told by your health care provider. Ask your health care provider what activities are safe for you.  Take over-the-counter and prescription medicines only as told by your health care provider.  If you smoke, do not smoke without supervision.  Keep all follow-up visits as told by your health care provider. This is important. Contact a health care provider if:  You have nausea or vomiting that does not get better with medicine.  You cannot eat or drink without vomiting.  You have pain that does not get better with medicine.  You are unable to pass urine.  You develop a skin rash.  You have a fever.  You have redness around your IV site that gets worse. Get help right away if:  You have difficulty breathing.  You have chest pain.  You have blood in your urine or stool, or you vomit blood. Summary  After the procedure, it is common to have a sore throat or nausea. It is also common to feel tired.  Have a responsible adult stay with you for the time you are told. It is important to have someone help care for you until you are awake and alert.  When you feel hungry, start by eating small amounts of foods that are soft and easy to digest (bland), such as toast. Gradually return to your regular diet.  Drink enough fluid to keep your urine pale yellow.  Return to your normal activities as told by your health care provider. Ask your health care provider what activities are safe for you. This information is not intended to replace advice given to you by your health care provider. Make sure you discuss any  questions you have with your health care provider. Document Revised: 01/12/2020 Document Reviewed: 08/11/2019 Elsevier Patient Education  2021 [place]. How to Use Chlorhexidine for Bathing Chlorhexidine gluconate (CHG) is a germ-killing (antiseptic) solution that is used to clean the skin. It can get rid of the bacteria  that normally live on the skin and can keep them away for about 24 hours. To clean your skin with CHG, you may be given:  A CHG solution to use in the shower or as part of a sponge bath.  A prepackaged cloth that contains CHG. Cleaning your skin with CHG may help lower the risk for infection:  While you are staying in the intensive care unit of the hospital.  If you have a vascular access, such as a central line, to provide short-term or long-term access to your veins.  If you have a catheter to drain urine from your bladder.  If you are on a ventilator. A ventilator is a machine that helps you breathe by moving air in and out of your lungs.  After surgery. What are the risks? Risks of using CHG include:  A skin reaction.  Hearing loss, if CHG gets in your ears.  Eye injury, if CHG gets in your eyes and is not rinsed out.  The CHG product catching fire. Make sure that you avoid smoking and flames after applying CHG to your skin. Do not use CHG:  If you have a chlorhexidine allergy or have previously reacted to chlorhexidine.  On babies younger than 24 months of age. How to use CHG solution  Use CHG only as told by your health care provider, and follow the instructions on the label.  Use the full amount of CHG as directed. Usually, this is one bottle. During a shower Follow these steps when using CHG solution during a shower (unless your health care provider gives you different instructions): 1. Start the shower. 2. Use your normal soap and shampoo to wash your face and hair. 3. Turn off the shower or move out of the shower stream. 4. Pour the CHG  onto a clean washcloth. Do not use any type of brush or rough-edged sponge. 5. Starting at your neck, lather your body down to your toes. Make sure you follow these instructions: ? If you will be having surgery, pay special attention to the part of your body where you will be having surgery. Scrub this area for at least 1 minute. ? Do not use CHG on your head or face. If the solution gets into your ears or eyes, rinse them well with water. ? Avoid your genital area. ? Avoid any areas of skin that have broken skin, cuts, or scrapes. ? Scrub your back and under your arms. Make sure to wash skin folds. 6. Let the lather sit on your skin for 1-2 minutes or as long as told by your health care provider. 7. Thoroughly rinse your entire body in the shower. Make sure that all body creases and crevices are rinsed well. 8. Dry off with a clean towel. Do not put any substances on your body afterward--such as powder, lotion, or perfume--unless you are told to do so by your health care provider. Only use lotions that are recommended by the manufacturer. 9. Put on clean clothes or pajamas. 10. If it is the night before your surgery, sleep in clean sheets.   During a sponge bath Follow these steps when using CHG solution during a sponge bath (unless your health care provider gives you different instructions): 1. Use your normal soap and shampoo to wash your face and hair. 2. Pour the CHG onto a clean washcloth. 3. Starting at your neck, lather your body down to your toes. Make sure you follow these instructions: ? If you will be having  surgery, pay special attention to the part of your body where you will be having surgery. Scrub this area for at least 1 minute. ? Do not use CHG on your head or face. If the solution gets into your ears or eyes, rinse them well with water. ? Avoid your genital area. ? Avoid any areas of skin that have broken skin, cuts, or scrapes. ? Scrub your back and under your arms. Make  sure to wash skin folds. 4. Let the lather sit on your skin for 1-2 minutes or as long as told by your health care provider. 5. Using a different clean, wet washcloth, thoroughly rinse your entire body. Make sure that all body creases and crevices are rinsed well. 6. Dry off with a clean towel. Do not put any substances on your body afterward--such as powder, lotion, or perfume--unless you are told to do so by your health care provider. Only use lotions that are recommended by the manufacturer. 7. Put on clean clothes or pajamas. 8. If it is the night before your surgery, sleep in clean sheets. How to use CHG prepackaged cloths  Only use CHG cloths as told by your health care provider, and follow the instructions on the label.  Use the CHG cloth on clean, dry skin.  Do not use the CHG cloth on your head or face unless your health care provider tells you to.  When washing with the CHG cloth: ? Avoid your genital area. ? Avoid any areas of skin that have broken skin, cuts, or scrapes. Before surgery Follow these steps when using a CHG cloth to clean before surgery (unless your health care provider gives you different instructions): 1. Using the CHG cloth, vigorously scrub the part of your body where you will be having surgery. Scrub using a back-and-forth motion for 3 minutes. The area on your body should be completely wet with CHG when you are done scrubbing. 2. Do not rinse. Discard the cloth and let the area air-dry. Do not put any substances on the area afterward, such as powder, lotion, or perfume. 3. Put on clean clothes or pajamas. 4. If it is the night before your surgery, sleep in clean sheets.   For general bathing Follow these steps when using CHG cloths for general bathing (unless your health care provider gives you different instructions). 1. Use a separate CHG cloth for each area of your body. Make sure you wash between any folds of skin and between your fingers and toes. Wash  your body in the following order, switching to a new cloth after each step: ? The front of your neck, shoulders, and chest. ? Both of your arms, under your arms, and your hands. ? Your stomach and groin area, avoiding the genitals. ? Your right leg and foot. ? Your left leg and foot. ? The back of your neck, your back, and your buttocks. 2. Do not rinse. Discard the cloth and let the area air-dry. Do not put any substances on your body afterward--such as powder, lotion, or perfume--unless you are told to do so by your health care provider. Only use lotions that are recommended by the manufacturer. 3. Put on clean clothes or pajamas. Contact a health care provider if:  Your skin gets irritated after scrubbing.  You have questions about using your solution or cloth. Get help right away if:  Your eyes become very red or swollen.  Your eyes itch badly.  Your skin itches badly and is red or swollen.  Your hearing changes.  You have trouble seeing.  You have swelling or tingling in your mouth or throat.  You have trouble breathing.  You swallow any chlorhexidine. Summary  Chlorhexidine gluconate (CHG) is a germ-killing (antiseptic) solution that is used to clean the skin. Cleaning your skin with CHG may help to lower your risk for infection.  You may be given CHG to use for bathing. It may be in a bottle or in a prepackaged cloth to use on your skin. Carefully follow your health care provider's instructions and the instructions on the product label.  Do not use CHG if you have a chlorhexidine allergy.  Contact your health care provider if your skin gets irritated after scrubbing. This information is not intended to replace advice given to you by your health care provider. Make sure you discuss any questions you have with your health care provider. Document Revised: 10/14/2019 Document Reviewed: 10/14/2019 Elsevier Patient Education  [place].

## 2020-10-10 NOTE — Progress Notes (Signed)
Urological Symptom Review  Patient is experiencing the following symptoms: Burning/pain with urination Get up at night to urinate Blood in urine   Review of Systems  Gastrointestinal (upper)  : Negative for upper GI symptoms  Gastrointestinal (lower) : Negative for lower GI symptoms  Constitutional : Negative for symptoms  Skin: Negative for skin symptoms  Eyes: Negative for eye symptoms  Ear/Nose/Throat : Negative for Ear/Nose/Throat symptoms  Hematologic/Lymphatic: Negative for Hematologic/Lymphatic symptoms  Cardiovascular : Leg swelling  Respiratory : Negative for respiratory symptoms  Endocrine: Negative for endocrine symptoms  Musculoskeletal: Back pain  Neurological: Headaches  Psychologic: Negative for psychiatric symptoms

## 2020-10-11 ENCOUNTER — Encounter (HOSPITAL_COMMUNITY): Payer: Self-pay

## 2020-10-11 ENCOUNTER — Encounter (HOSPITAL_COMMUNITY)
Admission: RE | Admit: 2020-10-11 | Discharge: 2020-10-11 | Disposition: A | Discharge status: Home / Self Care | Payer: BC Managed Care – PPO | Source: Ambulatory Visit | Attending: Urology | Admitting: Urology

## 2020-10-11 DIAGNOSIS — Z01812 Encounter for preprocedural laboratory examination: Secondary | ICD-10-CM | POA: Insufficient documentation

## 2020-10-11 LAB — CBC WITH DIFFERENTIAL/PLATELET
Abs Immature Granulocytes: 0.01 10*3/uL (ref 0.00–0.07)
Basophils Absolute: 0.1 10*3/uL (ref 0.0–0.1)
Basophils Relative: 1 %
Eosinophils Absolute: 0.2 10*3/uL (ref 0.0–0.5)
Eosinophils Relative: 3 %
HCT: 43.3 % (ref 39.0–52.0)
Hemoglobin: 14.2 g/dL (ref 13.0–17.0)
Immature Granulocytes: 0 %
Lymphocytes Relative: 41 %
Lymphs Abs: 2.5 10*3/uL (ref 0.7–4.0)
MCH: 28.4 pg (ref 26.0–34.0)
MCHC: 32.8 g/dL (ref 30.0–36.0)
MCV: 86.6 fL (ref 80.0–100.0)
Monocytes Absolute: 0.7 10*3/uL (ref 0.1–1.0)
Monocytes Relative: 12 %
Neutro Abs: 2.5 10*3/uL (ref 1.7–7.7)
Neutrophils Relative %: 43 %
Platelets: 265 10*3/uL (ref 150–400)
RBC: 5 MIL/uL (ref 4.22–5.81)
RDW: 13.6 % (ref 11.5–15.5)
WBC: 6 10*3/uL (ref 4.0–10.5)
nRBC: 0 % (ref 0.0–0.2)

## 2020-10-11 LAB — BASIC METABOLIC PANEL
Anion gap: 8 (ref 5–15)
BUN: 20 mg/dL (ref 6–20)
CO2: 24 mmol/L (ref 22–32)
Calcium: 9.4 mg/dL (ref 8.9–10.3)
Chloride: 105 mmol/L (ref 98–111)
Creatinine, Ser: 0.81 mg/dL (ref 0.61–1.24)
GFR, Estimated: >60 mL/min (ref >60)
Glucose, Bld: 86 mg/dL (ref 70–99)
Potassium: 4 mmol/L (ref 3.5–5.1)
Sodium: 137 mmol/L (ref 135–145)

## 2020-10-15 ENCOUNTER — Ambulatory Visit (HOSPITAL_COMMUNITY): Payer: BC Managed Care – PPO

## 2020-10-15 ENCOUNTER — Ambulatory Visit (HOSPITAL_COMMUNITY)
Admission: RE | Admit: 2020-10-15 | Discharge: 2020-10-15 | Disposition: A | Discharge status: Home / Self Care | Payer: BC Managed Care – PPO | Attending: Urology | Admitting: Urology

## 2020-10-15 ENCOUNTER — Ambulatory Visit (HOSPITAL_COMMUNITY): Payer: BC Managed Care – PPO | Admitting: Anesthesiology

## 2020-10-15 ENCOUNTER — Encounter (HOSPITAL_COMMUNITY)
Admission: RE | Disposition: A | Discharge status: Home / Self Care | Payer: Self-pay | Source: Home / Self Care | Attending: Urology

## 2020-10-15 ENCOUNTER — Encounter (HOSPITAL_COMMUNITY): Payer: Self-pay | Admitting: Urology

## 2020-10-15 DIAGNOSIS — Z7982 Long term (current) use of aspirin: Secondary | ICD-10-CM | POA: Diagnosis not present

## 2020-10-15 DIAGNOSIS — Z87891 Personal history of nicotine dependence: Secondary | ICD-10-CM | POA: Insufficient documentation

## 2020-10-15 DIAGNOSIS — D494 Neoplasm of unspecified behavior of bladder: Secondary | ICD-10-CM | POA: Insufficient documentation

## 2020-10-15 DIAGNOSIS — Z79899 Other long term (current) drug therapy: Secondary | ICD-10-CM | POA: Insufficient documentation

## 2020-10-15 DIAGNOSIS — C672 Malignant neoplasm of lateral wall of bladder: Secondary | ICD-10-CM | POA: Diagnosis not present

## 2020-10-15 DIAGNOSIS — Z86718 Personal history of other venous thrombosis and embolism: Secondary | ICD-10-CM | POA: Insufficient documentation

## 2020-10-15 DIAGNOSIS — Z791 Long term (current) use of non-steroidal anti-inflammatories (NSAID): Secondary | ICD-10-CM | POA: Diagnosis not present

## 2020-10-15 HISTORY — PX: CYSTOSCOPY W/ RETROGRADES: SHX1426

## 2020-10-15 HISTORY — PX: TRANSURETHRAL RESECTION OF BLADDER TUMOR: SHX2575

## 2020-10-15 SURGERY — CYSTOSCOPY, WITH RETROGRADE PYELOGRAM
Anesthesia: General | Site: Bladder

## 2020-10-15 MED ORDER — OXYCODONE-ACETAMINOPHEN 5-325 MG PO TABS: 1.0000 | ORAL_TABLET | ORAL | 0 refills | Status: DC | PRN

## 2020-10-15 MED ORDER — STERILE WATER FOR IRRIGATION IR SOLN
Status: DC | PRN
  Administered 2020-10-15: 500 mL

## 2020-10-15 MED ORDER — DEXAMETHASONE SODIUM PHOSPHATE 10 MG/ML IJ SOLN
INTRAMUSCULAR | Status: AC
  Filled 2020-10-15: qty 1

## 2020-10-15 MED ORDER — ONDANSETRON HCL 4 MG/2ML IJ SOLN
4.0000 mg | Freq: Once | INTRAMUSCULAR | Status: AC | PRN
  Administered 2020-10-15: 4 mg via INTRAVENOUS
  Filled 2020-10-15: qty 2

## 2020-10-15 MED ORDER — LIDOCAINE HCL (CARDIAC) PF 100 MG/5ML IV SOSY
PREFILLED_SYRINGE | INTRAVENOUS | Status: DC | PRN
  Administered 2020-10-15: 100 mg via INTRAVENOUS

## 2020-10-15 MED ORDER — HYDROMORPHONE HCL 1 MG/ML IJ SOLN
0.2500 mg | INTRAMUSCULAR | Status: DC | PRN
  Administered 2020-10-15 (×2): 0.5 mg via INTRAVENOUS
  Filled 2020-10-15 (×2): qty 0.5

## 2020-10-15 MED ORDER — MIDAZOLAM HCL 5 MG/5ML IJ SOLN
INTRAMUSCULAR | Status: DC | PRN
  Administered 2020-10-15: 2 mg via INTRAVENOUS

## 2020-10-15 MED ORDER — ROCURONIUM BROMIDE 10 MG/ML (PF) SYRINGE
PREFILLED_SYRINGE | INTRAVENOUS | Status: AC
  Filled 2020-10-15: qty 10

## 2020-10-15 MED ORDER — CEFAZOLIN SODIUM-DEXTROSE 2-4 GM/100ML-% IV SOLN
INTRAVENOUS | Status: AC
  Filled 2020-10-15: qty 100

## 2020-10-15 MED ORDER — DIATRIZOATE MEGLUMINE 30 % UR SOLN
URETHRAL | Status: AC
  Filled 2020-10-15: qty 100

## 2020-10-15 MED ORDER — SUGAMMADEX SODIUM 200 MG/2ML IV SOLN
INTRAVENOUS | Status: DC | PRN
  Administered 2020-10-15: 250 mg via INTRAVENOUS

## 2020-10-15 MED ORDER — ORAL CARE MOUTH RINSE: 15.0000 mL | Freq: Once | OROMUCOSAL | Status: AC

## 2020-10-15 MED ORDER — CHLORHEXIDINE GLUCONATE 0.12 % MT SOLN: 15.0000 mL | Freq: Once | OROMUCOSAL | Status: AC

## 2020-10-15 MED ORDER — CEFAZOLIN SODIUM-DEXTROSE 2-4 GM/100ML-% IV SOLN
2.0000 g | INTRAVENOUS | Status: AC
  Administered 2020-10-15: 2 g via INTRAVENOUS

## 2020-10-15 MED ORDER — DIATRIZOATE MEGLUMINE 30 % UR SOLN
URETHRAL | Status: DC | PRN
  Administered 2020-10-15: 10 mL via URETHRAL

## 2020-10-15 MED ORDER — DEXAMETHASONE SODIUM PHOSPHATE 10 MG/ML IJ SOLN
INTRAMUSCULAR | Status: DC | PRN
  Administered 2020-10-15: 10 mg via INTRAVENOUS

## 2020-10-15 MED ORDER — CHLORHEXIDINE GLUCONATE 0.12 % MT SOLN
OROMUCOSAL | Status: AC
  Administered 2020-10-15: 15 mL via OROMUCOSAL
  Filled 2020-10-15: qty 15

## 2020-10-15 MED ORDER — FENTANYL CITRATE (PF) 100 MCG/2ML IJ SOLN
INTRAMUSCULAR | Status: AC
  Filled 2020-10-15: qty 2

## 2020-10-15 MED ORDER — LIDOCAINE HCL (PF) 2 % IJ SOLN
INTRAMUSCULAR | Status: AC
  Filled 2020-10-15: qty 5

## 2020-10-15 MED ORDER — LACTATED RINGERS IV SOLN: INTRAVENOUS | Status: DC

## 2020-10-15 MED ORDER — SUGAMMADEX SODIUM 500 MG/5ML IV SOLN
INTRAVENOUS | Status: AC
  Filled 2020-10-15: qty 5

## 2020-10-15 MED ORDER — MEPERIDINE HCL 50 MG/ML IJ SOLN: 6.2500 mg | INTRAMUSCULAR | Status: DC | PRN

## 2020-10-15 MED ORDER — ONDANSETRON HCL 4 MG/2ML IJ SOLN
INTRAMUSCULAR | Status: AC
  Filled 2020-10-15: qty 2

## 2020-10-15 MED ORDER — FENTANYL CITRATE (PF) 100 MCG/2ML IJ SOLN
INTRAMUSCULAR | Status: DC | PRN
  Administered 2020-10-15: 100 ug via INTRAVENOUS

## 2020-10-15 MED ORDER — PROPOFOL 10 MG/ML IV BOLUS
INTRAVENOUS | Status: AC
  Filled 2020-10-15: qty 20

## 2020-10-15 MED ORDER — SODIUM CHLORIDE 0.9 % IR SOLN
Status: DC | PRN
  Administered 2020-10-15 (×2): 3000 mL via INTRAVESICAL

## 2020-10-15 MED ORDER — PROPOFOL 10 MG/ML IV BOLUS
INTRAVENOUS | Status: DC | PRN
  Administered 2020-10-15: 200 mg via INTRAVENOUS

## 2020-10-15 MED ORDER — ONDANSETRON HCL 4 MG/2ML IJ SOLN
INTRAMUSCULAR | Status: DC | PRN
  Administered 2020-10-15: 4 mg via INTRAVENOUS

## 2020-10-15 MED ORDER — MIDAZOLAM HCL 2 MG/2ML IJ SOLN
INTRAMUSCULAR | Status: AC
  Filled 2020-10-15: qty 2

## 2020-10-15 MED ORDER — ROCURONIUM BROMIDE 100 MG/10ML IV SOLN
INTRAVENOUS | Status: DC | PRN
  Administered 2020-10-15: 40 mg via INTRAVENOUS

## 2020-10-15 SURGICAL SUPPLY — 28 items
BAG DRAIN URO TABLE W/ADPT NS (BAG) ×3 IMPLANT
BAG DRN 8 ADPR NS SKTRN CSTL (BAG) ×2
BAG DRN RND TRDRP ANRFLXCHMBR (UROLOGICAL SUPPLIES) ×2
BAG HAMPER (MISCELLANEOUS) ×3 IMPLANT
BAG URINE DRAIN 2000ML AR STRL (UROLOGICAL SUPPLIES) ×3 IMPLANT
CATH FOLEY LATEX FREE 22FR (CATHETERS) ×3
CATH FOLEY LF 22FR (CATHETERS) ×2 IMPLANT
CATH INTERMIT  6FR 70CM (CATHETERS) ×3 IMPLANT
CLOTH BEACON ORANGE TIMEOUT ST (SAFETY) ×3 IMPLANT
DECANTER SPIKE VIAL GLASS SM (MISCELLANEOUS) ×3 IMPLANT
ELECT LOOP 22F BIPOLAR SML (ELECTROSURGICAL) ×3
ELECTRODE LOOP 22F BIPOLAR SML (ELECTROSURGICAL) ×2 IMPLANT
GLOVE BIO SURGEON STRL SZ8 (GLOVE) ×3 IMPLANT
GLOVE SURG UNDER POLY LF SZ7 (GLOVE) ×6 IMPLANT
GOWN STRL REUS W/TWL LRG LVL3 (GOWN DISPOSABLE) ×3 IMPLANT
GOWN STRL REUS W/TWL XL LVL3 (GOWN DISPOSABLE) ×3 IMPLANT
IV NS IRRIG 3000ML ARTHROMATIC (IV SOLUTION) ×6 IMPLANT
KIT TURNOVER CYSTO (KITS) ×3 IMPLANT
MANIFOLD NEPTUNE II (INSTRUMENTS) ×3 IMPLANT
PACK CYSTO (CUSTOM PROCEDURE TRAY) ×3 IMPLANT
PAD ARMBOARD 7.5X6 YLW CONV (MISCELLANEOUS) ×3 IMPLANT
PLUG CATH AND CAP STER (CATHETERS) ×3 IMPLANT
STENT URET 6FRX26 CONTOUR (STENTS) ×3 IMPLANT
SYR 30ML LL (SYRINGE) ×3 IMPLANT
SYR TOOMEY IRRIG 70ML (MISCELLANEOUS) ×3
SYRINGE TOOMEY IRRIG 70ML (MISCELLANEOUS) ×2 IMPLANT
TOWEL OR 17X26 4PK STRL BLUE (TOWEL DISPOSABLE) ×3 IMPLANT
WATER STERILE IRR 500ML POUR (IV SOLUTION) ×3 IMPLANT

## 2020-10-15 NOTE — Transfer of Care (Signed)
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  Last Pain:  Vitals:   10/15/20 1306  TempSrc: Oral  PainSc: 0-No pain      Patients Stated Pain Goal: 5 (68/12/75 1700)  Complications: No complications documented.

## 2020-10-15 NOTE — Anesthesia Preprocedure Evaluation (Signed)
Anesthesia Evaluation  Patient identified by MRN, date of birth, ID band Patient awake    Reviewed: Allergy & Precautions, NPO status , Patient's Chart, lab work & pertinent test results  History of Anesthesia Complications Negative for: history of anesthetic complications  Airway Mallampati: II  TM Distance: >3 FB Neck ROM: Full  Mouth opening: Limited Mouth Opening  Dental  (+) Dental Advisory Given, Teeth Intact   Pulmonary neg sleep apnea (snoring), former smoker,  Snoring    Pulmonary exam normal breath sounds clear to auscultation       Cardiovascular Exercise Tolerance: Good + DVT  Normal cardiovascular exam+ dysrhythmias (ablation )  Rhythm:Regular Rate:Normal  26-Mar-2020 14:14:11 [place] [facility]-ED ROUTINE RECORD Normal sinus rhythm Baseline wander Artifact Otherwise within normal limits Confirmed by Carmin Muskrat [phone]) on 03/26/2020 4:45:51 PM   Neuro/Psych  Headaches, negative psych ROS   GI/Hepatic negative GI ROS, Neg liver ROS,   Endo/Other  negative endocrine ROS  Renal/GU negative Renal ROS     Musculoskeletal negative musculoskeletal ROS (+)   Abdominal   Peds  Hematology negative hematology ROS (+)   Anesthesia Other Findings Snoring   Reproductive/Obstetrics                            Anesthesia Physical Anesthesia Plan  ASA: II  Anesthesia Plan: General   Post-op Pain Management:    Induction: Intravenous  PONV Risk Score and Plan: Propofol infusion  Airway Management Planned: LMA and Oral ETT  Additional Equipment:   Intra-op Plan:   Post-operative Plan: Extubation in OR  Informed Consent: I have reviewed the patients History and Physical, chart, labs and discussed the procedure including the risks, benefits and alternatives for the proposed anesthesia with the patient or authorized representative who has indicated his/her  understanding and acceptance.       Plan Discussed with: CRNA and Surgeon  Anesthesia Plan Comments:         Anesthesia Quick Evaluation

## 2020-10-15 NOTE — Anesthesia Procedure Notes (Signed)
Procedure Name: Intubation Date/Time: 10/15/2020 1:36 PM Performed by: Riki Sheer, CRNA Pre-anesthesia Checklist: Patient identified, Emergency Drugs available, Suction available, Patient being monitored and Timeout performed Patient Re-evaluated:Patient Re-evaluated prior to induction Oxygen Delivery Method: Circle system utilized Preoxygenation: Pre-oxygenation with 100% oxygen Induction Type: IV induction Ventilation: Mask ventilation without difficulty Laryngoscope Size: Glidescope and 4 Grade View: Grade I Tube type: Oral Tube size: 7.0 mm Number of attempts: 2 Airway Equipment and Method: Video-laryngoscopy Placement Confirmation: ETT inserted through vocal cords under direct vision,  positive ETCO2,  CO2 detector and breath sounds checked- equal and bilateral Secured at: 24 cm Tube secured with: Tape Dental Injury: Teeth and Oropharynx as per pre-operative assessment  Difficulty Due To: Difficulty was unanticipated, Difficult Airway- due to large tongue and Difficult Airway- due to immobile epiglottis Comments: DL x1 with Miller 2, grade 3 view. DL x1 with glidescope. Grade 1 view, easy intubation. Easy mask ventilation without oral airway.

## 2020-10-15 NOTE — Discharge Instructions (Addendum)
Transurethral Resection of Bladder Tumor, Care After This sheet gives you information about how to care for yourself after your procedure. Your health care provider may also give you more specific instructions. If you have problems or questions, contact your health care provider. What can I expect after the procedure? After the procedure, it is common to have:  A small amount of blood in your urine for up to 2 weeks.  Soreness or mild pain from your catheter. After your catheter is removed, you may have mild soreness, especially when urinating.  Pain in your lower abdomen. Follow these instructions at home: Medicines  Take over-the-counter and prescription medicines only as told by your health care provider.  If you were prescribed an antibiotic medicine, take it as told by your health care provider. Do not stop taking the antibiotic even if you start to feel better.  Do not drive for 24 hours if you were given a sedative during your procedure.  Ask your health care provider if the medicine prescribed to you: ? Requires you to avoid driving or using heavy machinery. ? Can cause constipation. You may need to take these actions to prevent or treat constipation:  Take over-the-counter or prescription medicines.  Eat foods that are high in fiber, such as beans, whole grains, and fresh fruits and vegetables.  Limit foods that are high in fat and processed sugars, such as fried or sweet foods.   Activity  Return to your normal activities as told by your health care provider. Ask your health care provider what activities are safe for you.  Do not lift anything that is heavier than 10 lb (4.5 kg), or the limit that you are told, until your health care provider says that it is safe.  Avoid intense physical activity for as long as told by your health care provider.  Rest as told by your health care provider.  Avoid sitting for a long time without moving. Get up to take short walks every  1-2 hours. This is important to improve blood flow and breathing. Ask for help if you feel weak or unsteady. General instructions  Do not drink alcohol for as long as told by your health care provider. This is especially important if you are taking prescription pain medicines.  Do not take baths, swim, or use a hot tub until your health care provider approves. Ask your health care provider if you may take showers. You may only be allowed to take sponge baths.  If you have a catheter, follow instructions from your health care provider about caring for your catheter and your drainage bag.  Drink enough fluid to keep your urine pale yellow.  Wear compression stockings as told by your health care provider. These stockings help to prevent blood clots and reduce swelling in your legs.  Keep all follow-up visits as told by your health care provider. This is important. ? You will need to be followed closely with regular checks of your bladder and urethra (cystoscopies) to make sure that the cancer does not come back.   Contact a health care provider if:  You have pain that gets worse or does not improve with medicine.  You have blood in your urine for more than 2 weeks.  You have cloudy or bad-smelling urine.  You become constipated. Signs of constipation may include having: ? Fewer than three bowel movements in a week. ? Difficulty having a bowel movement. ? Stools that are dry, hard, or larger than normal.  You have a fever. Get help right away if:  You have: ? Severe pain. ? Bright red blood in your urine. ? Blood clots in your urine. ? A lot of blood in your urine.  Your catheter has been removed and you are not able to urinate.  You have a catheter in place and the catheter is not draining urine. Summary  After your procedure, it is common to have a small amount of blood in your urine, soreness or mild pain from your catheter, and pain in your lower abdomen.  Take  over-the-counter and prescription medicines only as told by your health care provider.  Rest as told by your health care provider. Follow your health care provider's instructions about returning to normal activities. Ask what activities are safe for you.  If you have a catheter, follow instructions from your health care provider about caring for your catheter and your drainage bag.  Get help right away if you cannot urinate, you have severe pain, or you have bright red blood or blood clots in your urine. This information is not intended to replace advice given to you by your health care provider. Make sure you discuss any questions you have with your health care provider. Document Revised: 11/26/2017 Document Reviewed: 11/26/2017 Elsevier Patient Education  2021 [place] and [place]'s gastrointestinal and liver disease (10th ed., vol. 1, pp. 270-296). [place], [place]: [employer].">  How to Prevent Constipation After Surgery Constipation is a common problem after surgery. Many things can make constipation more likely after a surgery, including:  Certain medicines, especially numbing medicines (anesthetics) and very strong pain medicines called opioids.  Feeling stressed because of the surgery.  Eating different foods than normal.  Being less active. Symptoms of constipation include:  Having fewer than three bowel movements a week.  Straining to have a bowel movement.  Having hard, dry, or larger-than-normal stools (feces).  Discomfort in the lower abdomen, such as cramps or bloating.  Not feeling relief after having a bowel movement.  Nausea and vomiting. You can take steps to help prevent constipation after surgery. Follow these instructions at home: Eating and drinking  Eat foods that have a lot of fiber in them, such as beans, bran, whole grains, and fresh fruits and vegetables.  Limit foods that are high in fat and processed sugars, such as  fried or sweet foods. These include french fries, hamburgers, cookies, and candy.  Take a fiber supplement as told by your health care provider. If you are not taking a fiber supplement and you think you are not getting enough fiber from foods, talk to your health care provider about adding a fiber supplement to your diet.  Drink enough fluid to keep your urine pale yellow.  Drink clear fluids, especially water. Avoid drinking alcohol, caffeine, and soda. These can make constipation worse.   Activity  After surgery, return to your normal activities slowly, or when your health care provider says it is okay.  Start walking as soon as you can. Try to go a little farther each day.  Once your health care provider approves, do some sort of regular exercise. This helps prevent constipation.   Bowel movements  Go to the restroom when you have the urge to go. Do not hold it in.  Try drinking something hot to get a bowel movement started.  Keep track of how often you use the restroom. Medicines  Take over-the-counter and prescription medicines only as told by your health  care provider.  Talk to your health care provider about medicines that may help prevent constipation, particularly if you have a history of constipation. Your health care provider may suggest a stool softener, laxative, or fiber supplement.  Do not take any medicines without talking to your health care provider first. Contact a health care provider if:  You used stool softeners or laxatives and still have not had a bowel movement within 24-48 hours after using them.  You have not had a bowel movement in 3 days.  You have a fever. Get help right away if you have:  Constipation that lasts for more than 4 days or if your symptoms get worse.  Bright red blood in your stool.  Pain in the abdomen or rectum.  Very bad cramping.  Thin, pencil-like stools.  Unexplained weight loss. Summary  Constipation is a common  problem after surgery. Many things can make constipation more likely after a surgery, including certain medicines, eating different foods than normal, and being less active.  Symptoms of constipation include having fewer than three bowel movements a week, straining to have a bowel movement, and cramps or bloating in the lower abdomen.  To help prevent constipation, you should eat foods that are high in fiber, drink plenty of fluids, and get regular physical activity.  Your health care provider may suggest medicines, such as stool softeners or laxatives, to help prevent constipation. This information is not intended to replace advice given to you by your health care provider. Make sure you discuss any questions you have with your health care provider. Document Revised: 03/16/2019 Document Reviewed: 03/16/2019 Elsevier Patient Education  2021 [place], Adult An indwelling urinary catheter is a thin tube that is put into your bladder. The tube helps to drain pee (urine) out of your body. The tube goes in through your urethra. Your urethra is where pee comes out of your body. Your pee will come out through the catheter, then it will go into a bag (drainage bag). Take good care of your catheter so it will work well. How to wear your catheter and bag Supplies needed  Sticky tape (adhesive tape) or a leg strap.  Alcohol wipe or soap and water (if you use tape).  A clean towel (if you use tape).  Large overnight bag.  Smaller bag (leg bag). Wearing your catheter Attach your catheter to your leg with tape or a leg strap.  Make sure the catheter is not pulled tight.  If a leg strap gets wet, take it off and put on a dry strap.  If you use tape to hold the bag on your leg: 1. Use an alcohol wipe or soap and water to wash your skin where the tape made it sticky before. 2. Use a clean towel to pat-dry that skin. 3. Use new tape to make the bag stay on  your leg. Wearing your bags You should have been given a large overnight bag.  You may wear the overnight bag in the day or night.  Always have the overnight bag lower than your bladder.  Do not let the bag touch the floor.  Before you go to sleep, put a clean plastic bag in a wastebasket. Then hang the overnight bag inside the wastebasket. You should also have a smaller leg bag that fits under your clothes.  Always wear the leg bag below your knee.  Do not wear your leg bag at night. How to care  for your skin and catheter Supplies needed  A clean washcloth.  Water and mild soap.  A clean towel. Caring for your skin and catheter  Clean the skin around your catheter every day: 1. Wash your hands with soap and water. 2. Wet a clean washcloth in warm water and mild soap. 3. Clean the skin around your urethra.  If you are male:  Gently spread the folds of skin around your vagina (labia).  With the washcloth in your other hand, wipe the inner side of your labia on each side. Wipe from front to back.  If you are male:  Pull back any skin that covers the end of your penis (foreskin).  With the washcloth in your other hand, wipe your penis in small circles. Start wiping at the tip of your penis, then move away from the catheter.  Move the foreskin back in place, if needed. 4. With your free hand, hold the catheter close to where it goes into your body.  Keep holding the catheter during cleaning so it does not get pulled out. 5. With the washcloth in your other hand, clean the catheter.  Only wipe downward on the catheter.  Do not wipe upward toward your body. Doing this may push germs into your urethra and cause infection. 6. Use a clean towel to pat-dry the catheter and the skin around it. Make sure to wipe off all soap. 7. Wash your hands with soap and water.  Shower every day. Do not take baths.  Do not use cream, ointment, or lotion on the area where the catheter  goes into your body, unless your doctor tells you to.  Do not use powders, sprays, or lotions on your genital area.  Check your skin around the catheter every day for signs of infection. Check for: ? Redness, swelling, or pain. ? Fluid or blood. ? Warmth. ? Pus or a bad smell.      How to empty the bag Supplies needed  Rubbing alcohol.  Gauze pad or cotton ball.  Tape or a leg strap. Emptying the bag Pour the pee out of your bag when it is ?- full, or at least 2-3 times a day. Do this for your overnight bag and your leg bag. 1. Wash your hands with soap and water. 2. Separate (detach) the bag from your leg. 3. Hold the bag over the toilet or a clean pail. Keep the bag lower than your hips and bladder. This is so the pee (urine) does not go back into the tube. 4. Open the pour spout. It is at the bottom of the bag. 5. Empty the pee into the toilet or pail. Do not let the pour spout touch any surface. 6. Put rubbing alcohol on a gauze pad or cotton ball. 7. Use the gauze pad or cotton ball to clean the pour spout. 8. Close the pour spout. 9. Attach the bag to your leg with tape or a leg strap. 10. Wash your hands with soap and water. Follow instructions for cleaning the drainage bag:  From the product maker.  As told by your doctor. How to change the bag Supplies needed  Alcohol wipes.  A clean bag.  Tape or a leg strap. Changing the bag Replace your bag when it starts to leak, smell bad, or look dirty. 1. Wash your hands with soap and water. 2. Separate the dirty bag from your leg. 3. Pinch the catheter with your fingers so that pee does not spill  out. 4. Separate the catheter tube from the bag tube where these tubes connect (at the connection valve). Do not let the tubes touch any surface. 5. Clean the end of the catheter tube with an alcohol wipe. Use a different alcohol wipe to clean the end of the bag tube. 6. Connect the catheter tube to the tube of the clean  bag. 7. Attach the clean bag to your leg with tape or a leg strap. Do not make the bag tight on your leg. 8. Wash your hands with soap and water. General rules  Never pull on your catheter. Never try to take it out. Doing that can hurt you.  Always wash your hands before and after you touch your catheter or bag. Use a mild, fragrance-free soap. If you do not have soap and water, use hand sanitizer.  Always make sure there are no twists or bends (kinks) in the catheter tube.  Always make sure there are no leaks in the catheter or bag.  Drink enough fluid to keep your pee pale yellow.  Do not take baths, swim, or use a hot tub.  If you are male, wipe from front to back after you poop (have a bowel movement).   Contact a doctor if:  Your pee is cloudy.  Your pee smells worse than usual.  Your catheter gets clogged.  Your catheter leaks.  Your bladder feels full. Get help right away if:  You have redness, swelling, or pain where the catheter goes into your body.  You have fluid, blood, pus, or a bad smell coming from the area where the catheter goes into your body.  Your skin feels warm where the catheter goes into your body.  You have a fever.  You have pain in your: ? Belly (abdomen). ? Legs. ? Lower back. ? Bladder.  You see blood in the catheter.  Your pee is pink or red.  You feel sick to your stomach (nauseous).  You throw up (vomit).  You have chills.  Your pee is not draining into the bag.  Your catheter gets pulled out. Summary  An indwelling urinary catheter is a thin tube that is placed into the bladder to help drain pee (urine) out of the body.  The catheter is placed into the part of the body that drains pee from the bladder (urethra).  Taking good care of your catheter will keep it working properly and help prevent problems.  Always wash your hands before and after touching your catheter or bag.  Never pull on your catheter or try to  take it out. This information is not intended to replace advice given to you by your health care provider. Make sure you discuss any questions you have with your health care provider. Document Revised: 08/20/2018 Document Reviewed: 12/12/2016 Elsevier Patient Education  [place].

## 2020-10-15 NOTE — Op Note (Signed)
Preoperative diagnosis: Bladder Tumor  Postop diagnosis: Same  Procedure: 1.  Cystoscopy 2. Bilateral retrograde pyelography 3. Intra-operative fluoroscopy, under 1 hour, with interpretation 4.Transurethral resection of bladder tumor, medium 5. Left 6x26 JJ ureteral Stent Placement    Attending: Nicolette Bang  Anesthesia: General  Estimated blood loss: 5 cc  Drains: 1. 22 French Foley catheter 2. 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 No hydronephrosis or filling defects in either collecting system.  Indications: Patient is a 40 year old with a history of  bladder tumor found on office cystoscopy.   After discussing treatment options the patient decided to proceed with transurethral resection of bladder tumor  Procedure in detail: Prior to procedure consetn was obtained. Patient was brought to the operating room and briefing was done sure correct patient, correct procedure, correct site.  General anesthesia was in administered patient was placed in the dorsal lithotomy position.  The rigid 66 French cystoscope was passed urethra and bladder.  Bladder was inspected masses or lesions and we noted a diffuse centimeter lesion on the right lateral wall as well as a 3 cm lesion in the left lateral wall of the bladder.  We then cannulated the right ureteral orifice with a 6 French ureteral catheter.  A gentle retrograde was obtained in findings noted above.  We then turned our attention to the left ureteral orifice.  The left ureteral orifice was cannulated with a 6 French ureteral catheter.  A gentle retrograde was obtained in findings noted above.  We then placed a zip wire through the ureteral catheter and advanced up to the renal pelvis. We then placed a 6 x 26 double-J ureteral stent over the wire.  We then removed the wire and good coil was noted in the  pelvis under fluoroscopy in the bladder under direct vision.  We then removed the cystoscope and placed a 27 French resectoscope in the bladder.  Using bipolar electrocautery were then removed the bladder tumor.  We removed the bladder tumor down to the base exposing muscle.  We then removed the fragments and sent them for pathology.  To obtain hemostasis we then cauterized the bed of the tumors.  Once good hemostasis was noted the bladder was then drained and a 22 French Foley catheter was placed. This concluded the procedure which resulted by the patient.  Complications: None  Condition: Stable,  extubated, transferred to PACU.  Plan: The patient is to followup in 1 week for a voiding trial and pathology discussion

## 2020-10-15 NOTE — Interval H&P Note (Signed)
History and Physical Interval Note:  10/15/2020 1:14 PM  Ryan Conner  has presented today for surgery, with the diagnosis of * No pre-op diagnosis entered *.  The various methods of treatment have been discussed with the patient and family. After consideration of risks, benefits and other options for treatment, the patient has consented to  Procedure(s): CYSTOSCOPY WITH RETROGRADE PYELOGRAM (Bilateral) TRANSURETHRAL RESECTION OF BLADDER TUMOR (TURBT) (N/A) as a surgical intervention.  The patient's history has been reviewed, patient examined, no change in status, stable for surgery.  I have reviewed the patient's chart and labs.  Questions were answered to the patient's satisfaction.     Nicolette Bang

## 2020-10-15 NOTE — Anesthesia Postprocedure Evaluation (Signed)
Anesthesia Post Note  Patient: Ryan Conner  Procedure(s) Performed: CYSTOSCOPY WITH RETROGRADE PYELOGRAM (Bilateral ) TRANSURETHRAL RESECTION OF BLADDER TUMOR (TURBT) (N/A Bladder)  Patient location during evaluation: PACU Anesthesia Type: General Level of consciousness: awake and alert and oriented Pain management: pain level controlled Vital Signs Assessment: post-procedure vital signs reviewed and stable Respiratory status: spontaneous breathing and respiratory function stable Cardiovascular status: blood pressure returned to baseline and stable Postop Assessment: no apparent nausea or vomiting Anesthetic complications: no   No complications documented.   Last Vitals:  Vitals:   10/15/20 1306 10/15/20 1418  BP: (!) 128/92   Pulse: 69   Resp: 18   Temp: 36.7 C 36.6 C  SpO2: 98% 100%    Last Pain:  Vitals:   10/15/20 1418  TempSrc:   PainSc: 5                  Ryan Conner C Dylana Shaw

## 2020-10-16 ENCOUNTER — Encounter (HOSPITAL_COMMUNITY): Payer: Self-pay | Admitting: Urology

## 2020-10-16 ENCOUNTER — Telehealth: Payer: Self-pay

## 2020-10-16 DIAGNOSIS — C679 Malignant neoplasm of bladder, unspecified: Secondary | ICD-10-CM | POA: Diagnosis not present

## 2020-10-16 DIAGNOSIS — M7041 Prepatellar bursitis, right knee: Secondary | ICD-10-CM | POA: Diagnosis not present

## 2020-10-16 NOTE — Telephone Encounter (Signed)
Patient called office to state he was having bladder spasms/penile pain. Spoke with Dr. Alyson Ingles- samples given for myrbetriq 25mg . Patient will send daughtyer to pick up samples.

## 2020-10-18 LAB — SURGICAL PATHOLOGY

## 2020-10-22 ENCOUNTER — Encounter: Payer: Self-pay | Admitting: Urology

## 2020-10-22 ENCOUNTER — Other Ambulatory Visit: Payer: Self-pay

## 2020-10-22 ENCOUNTER — Ambulatory Visit (INDEPENDENT_AMBULATORY_CARE_PROVIDER_SITE_OTHER): Payer: BC Managed Care – PPO | Admitting: Urology

## 2020-10-22 VITALS — BP 114/94 | HR 81 | Ht 72.0 in | Wt 260.0 lb

## 2020-10-22 DIAGNOSIS — R310 Gross hematuria: Secondary | ICD-10-CM | POA: Diagnosis not present

## 2020-10-22 DIAGNOSIS — C672 Malignant neoplasm of lateral wall of bladder: Secondary | ICD-10-CM

## 2020-10-22 NOTE — Progress Notes (Signed)
Fill and Pull Catheter Removal  Patient is present today for a catheter removal.  Patient was cleaned and prepped in a sterile fashion 263ml of sterile water/ saline was instilled into the bladder when the patient felt the urge to urinate. 53ml of water was then drained from the balloon.  A 22FR foley cath was removed from the bladder no complications were noted .  Patient as then given some time to void on their own.  Patient can void  248ml on their own after some time.  Patient tolerated well.  Performed by: Novi Calia, LPN  Follow up/ Additional notes: per md note

## 2020-10-22 NOTE — Progress Notes (Signed)
10/22/2020 8:52 AM   Ryan Conner 1981/05/04 [id number]  Referring provider: Dettinger, Fransisca Kaufmann, MD [place],  [place] [id number]  Chief Complaint  Patient presents with   Hematuria    I am doing ok.    HPI: Mr Melhorn is a 40yo here for followup after bladder tumor resection. 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 He passed his voiding trial today. Mild suprapubic pain. He has a left ureteral stent in place.    PMH: Past Medical History:  Diagnosis Date   Arrhythmia    Deep vein thrombosis (DVT) (HCC)    Hyperlipidemia    Status post placement of implantable loop recorder 2014   [employer] Reveal LINQ - implanted in [place]    Surgical History: Past Surgical History:  Procedure Laterality Date   CYSTOSCOPY W/ RETROGRADES Bilateral 10/15/2020   Procedure: CYSTOSCOPY WITH RETROGRADE PYELOGRAM;  Surgeon: Cleon Gustin, MD;  Location: [facility];  Service: Urology;  Laterality: Bilateral;   Implantable loop recorder  2014   KNEE SURGERY Right    LOOP RECORDER REMOVAL     TRANSURETHRAL RESECTION OF BLADDER TUMOR N/A 10/15/2020   Procedure: TRANSURETHRAL RESECTION OF BLADDER TUMOR (TURBT);  Surgeon: Cleon Gustin, MD;  Location: [facility];  Service: Urology;  Laterality: N/A;   varicose veins removed   2012   bilateral legs     Home Medications:  Allergies as of 10/22/2020   No Known Allergies      Medication List        Accurate as of October 22, 2020  8:52 AM. If you have any questions, ask your nurse or doctor.          Acetaminophen Extra Strength 500 MG tablet Generic drug: acetaminophen Take 1,000 mg by mouth every 8 (eight) hours as needed for moderate pain.   aspirin 81 MG chewable tablet Chew 81 mg by mouth 2 (two) times daily.   diclofenac 75 MG EC tablet Commonly known as: VOLTAREN Take 75 mg by mouth 2 (two) times daily.   ondansetron 4 MG tablet Commonly known as: ZOFRAN Take 4 mg by mouth every 6 (six) hours as needed for vomiting or nausea.    oxyCODONE-acetaminophen 5-325 MG tablet Commonly known as: Percocet Take 1 tablet by mouth every 4 (four) hours as needed.        Allergies: No Known Allergies  Family History: Family History  Problem Relation Age of Onset   Hypertension Mother    Varicose Veins Mother    Meniere's disease Mother    Migraines Mother        used to get them alot more, also had vertigo    Alcoholism Paternal Grandfather     Social History:  reports that he quit smoking about 10 months ago. His smoking use included cigarettes. He started smoking about 28 years ago. He has a 12.50 pack-year smoking history. He has never used smokeless tobacco. He reports current alcohol use. He reports that he does not use drugs.  ROS: All other review of systems were reviewed and are negative except what is noted above in HPI  Physical Exam: BP (!) 114/94   Pulse 81   Ht 6' (1.829 m)   Wt 260 lb (117.9 kg)   BMI 35.26 kg/m   Constitutional:  Alert and oriented, No acute distress. HEENT: [place] AT, moist mucus membranes.  Trachea midline, no masses. Cardiovascular: No clubbing, cyanosis, or edema. Respiratory: Normal respiratory effort, no increased work of breathing. GI: Abdomen is  soft, nontender, nondistended, no abdominal masses GU: No CVA tenderness.  Lymph: No cervical or inguinal lymphadenopathy. Skin: No rashes, bruises or suspicious lesions. Neurologic: Grossly intact, no focal deficits, moving all 4 extremities. Psychiatric: Normal mood and affect.  Laboratory Data: Lab Results  Component Value Date   WBC 6.0 10/11/2020   HGB 14.2 10/11/2020   HCT 43.3 10/11/2020   MCV 86.6 10/11/2020   PLT 265 10/11/2020    Lab Results  Component Value Date   CREATININE 0.81 10/11/2020    No results found for: PSA  No results found for: TESTOSTERONE  No results found for: HGBA1C  Urinalysis    Component Value Date/Time   COLORURINE YELLOW 01/23/2019 1030   APPEARANCEUR Clear 10/10/2020 1335    LABSPEC 1.019 01/23/2019 1030   PHURINE 5.0 01/23/2019 1030   GLUCOSEU Negative 10/10/2020 1335   HGBUR LARGE (A) 01/23/2019 1030   BILIRUBINUR Negative 10/10/2020 1335   KETONESUR NEGATIVE 01/23/2019 1030   PROTEINUR Negative 10/10/2020 1335   PROTEINUR NEGATIVE 01/23/2019 1030   NITRITE Negative 10/10/2020 1335   NITRITE NEGATIVE 01/23/2019 1030   LEUKOCYTESUR Negative 10/10/2020 1335   LEUKOCYTESUR NEGATIVE 01/23/2019 1030    Lab Results  Component Value Date   LABMICR Comment 10/10/2020   WBCUA None seen 09/21/2020   LABEPIT None seen 09/21/2020   MUCUS Present 09/21/2020   BACTERIA Few 09/21/2020    Pertinent Imaging:  No results found for this or any previous visit.  No results found for this or any previous visit.  No results found for this or any previous visit.  No results found for this or any previous visit.  No results found for this or any previous visit.  No results found for this or any previous visit.  Results for orders placed during the hospital encounter of 09/21/20  CT HEMATURIA WORKUP  Narrative CLINICAL DATA:  Gross hematuria times 6-8 months with clots, history of nephrolithiasis  EXAM: CT ABDOMEN AND PELVIS WITHOUT AND WITH CONTRAST  TECHNIQUE: Multidetector CT imaging of the abdomen and pelvis was performed following the standard protocol before and following the bolus administration of intravenous contrast.  CONTRAST:  123mL OMNIPAQUE IOHEXOL 300 MG/ML  SOLN  COMPARISON:  CT abdomen pelvis July 16, 2020  FINDINGS: Lower chest: No acute abnormality. Normal size heart. No significant pericardial effusion/thickening.  Hepatobiliary: No suspicious hepatic lesion. Gallbladder is unremarkable. No biliary ductal dilation.  Pancreas: Within normal limits.  Spleen: Within normal limits.  Adrenals/Urinary Tract: Bilateral adrenal glands are unremarkable.  No hydronephrosis. Symmetric enhancement excretion of contrast  with bilateral kidneys. No suspicious filling defect visualized within the opacified portions of the collecting system or ureters on delayed imaging. However, the mid/distal right ureter is not opacified limiting evaluation.  No renal, ureteral or bladder calculi visualized. No solid enhancing renal masses.  In the posterior aspect of the left side of the urinary bladder there is a rounded 2.3 x 2.2 cm heterogeneous intermediate density lesion which does not demonstrate postcontrast enhancement on image 65/6 and 48/11.  Stomach/Bowel: Stomach is within normal limits. Appendix appears normal. No evidence of bowel wall thickening, distention, or inflammatory changes.  Vascular/Lymphatic: No significant vascular findings are present. No pathologically enlarged abdominal or pelvic lymph nodes.  Reproductive: Prostate is unremarkable.  Other: No abdominopelvic ascites.  Musculoskeletal: No aggressive lytic or blastic lesions of bone. No acute osseous abnormality.  IMPRESSION: 1. 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 Further evaluation with cystoscopy is recommended. 2. No renal, ureteral, or bladder calculi visualized. No hydronephrosis. 3. No solid enhancing renal masses.   Electronically Signed By: Dahlia Bailiff MD On: 09/21/2020 19:13  Results for orders placed during the hospital encounter of 01/23/19  CT Renal Stone Study  Narrative CLINICAL DATA:  Hematuria. Flank pain. History of kidney stones.  EXAM: CT ABDOMEN AND PELVIS WITHOUT CONTRAST  TECHNIQUE: Multidetector CT imaging of the abdomen and pelvis was performed following the standard protocol without IV contrast.  COMPARISON:  None.  FINDINGS: Lower chest: Normal.  Hepatobiliary: No focal liver abnormality is seen.  No gallstones, gallbladder wall thickening, or biliary dilatation.  Pancreas: Unremarkable. No pancreatic ductal dilatation or surrounding inflammatory changes.  Spleen: Normal in size without focal abnormality.  Adrenals/Urinary Tract: Adrenal glands are unremarkable. Kidneys are normal, without renal calculi, focal lesion, or hydronephrosis. Bladder is unremarkable.  Stomach/Bowel: Stomach is within normal limits. Appendix appears normal. No evidence of bowel wall thickening, distention, or inflammatory changes.  Vascular/Lymphatic: No significant vascular findings are present. No enlarged abdominal or pelvic lymph nodes.  Reproductive: Prostate is unremarkable.  Other: No abdominal wall hernia or abnormality. No abdominopelvic ascites.  Musculoskeletal: No acute or significant osseous findings.  IMPRESSION: Benign-appearing abdomen and pelvis.   Electronically Signed By: Lorriane Shire M.D. 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  Nicolette Bang, MD  [facility]

## 2020-10-22 NOTE — Patient Instructions (Signed)
Bladder Cancer Bladder cancer is a condition in which abnormal tissue (a tumor) grows in the bladder. The bladder is the organ that holds urine. Two tubes (ureters) carry the urine from the kidneys to the bladder. The bladder wall is made of layers of tissue. Cancer that spreads through these layers of the bladder wall becomes more difficult to treat. What are the causes? The cause of this condition is not known. What increases the risk? The following factors may make you more likely to develop this condition: Smoking. Working where there are risks (occupational exposures), such as working with rubber, leather, clothing fabric, dyes, chemicals, and paint. Being 55 years of age or older. Being male. Having bladder inflammation that is long-term (chronic). Having a history of cancer, including: A family history of bladder cancer. Personal experience with bladder cancer. Having had certain treatments for cancer before. These include: Medicines to kill cancer cells (chemotherapy). Strong X-ray beams or capsules high in energy to kill cancer cells and shrink tumors (radiation therapy). Having been exposed to arsenic. This is a chemical element that can poison you. What are the signs or symptoms? Early symptoms of this condition include: Seeing blood in your urine. Feeling pain when urinating. Having infections of your urinary system (urinary tract infections or UTIs) that happen often. Having to urinate sooner or more often than usual. Later symptoms of this condition include: Not being able to urinate. Pain on one side of your lower back. Loss of appetite. Weight loss. Tiredness (fatigue). Swelling in your feet. Bone pain. How is this diagnosed? This condition is diagnosed based on: Your medical history. A physical exam. Lab tests, such as urine tests. Imaging tests. Your symptoms. You may also have other tests or procedures done, such as: A cystoscopy. A narrow tube is inserted  into your bladder through the organ that connects your bladder to the outside of your body (urethra). This is done to view the lining of your bladder for tumors. A biopsy. This procedure involves removing a tissue sample to look at it under a microscope to see if cancer is present. It is important to find out: How deeply into the bladder wall cancer has grown. Whether cancer has spread to any other parts of your body. This may require blood tests or imaging tests, such as a CT scan, MRI, bone scan, or X-rays. How is this treated? Your health care provider may recommend one or more types of treatment based on the stage of your cancer. The most common types of treatment are: Surgery to remove the cancer. Procedures that may be done include: Removing a tumor on the inside wall of the bladder (transurethral resection). Removing the bladder (cystectomy). Radiation therapy. This is often used together with chemotherapy. Chemotherapy. Immunotherapy. This uses medicines to help your immune system destroy cancer cells. Follow these instructions at home: Take over-the-counter and prescription medicines only as told by your health care provider. Eat a healthy diet. Some of your treatments might affect your appetite. Do not use any products that contain nicotine or tobacco, such as cigarettes, e-cigarettes, and chewing tobacco. If you need help quitting, ask your health care provider. Consider joining a support group. This may help you learn to cope with the stress of having bladder cancer. Tell your cancer care team if you develop side effects. Your team may be able to recommend ways to get relief. Keep all follow-up visits as told by your health care provider. This is important. Where to find more information American   Cancer Society: www.cancer.org National Cancer Institute (NCI): www.cancer.gov Contact a health care provider if: You have symptoms of a urinary tract infection. These  include: Fever. Chills. Weakness. Muscle aches. Pain in your abdomen. Urge to urinate that is stronger and happens more often than usual. Burning feeling in the bladder or urethra when you urinate. Get help right away if: There is blood in your urine. You cannot urinate. You have severe pain or other symptoms that do not go away. Summary Bladder cancer is a condition in which tumors grow in the bladder and cause illness. This condition is diagnosed based on your medical history, a physical exam, lab tests, imaging tests, and your symptoms. Your health care provider may recommend one or more types of treatment based on the stage of your cancer. Consider joining a support group. This may help you learn to cope with the stress of having bladder cancer. This information is not intended to replace advice given to you by your health care provider. Make sure you discuss any questions you have with your health care provider. Document Revised: 01/05/2019 Document Reviewed: 01/05/2019 Elsevier Patient Education  2022 Elsevier Inc.  

## 2020-10-23 ENCOUNTER — Encounter: Payer: Self-pay | Admitting: Urology

## 2020-11-05 ENCOUNTER — Ambulatory Visit: Payer: BC Managed Care – PPO | Admitting: Urology

## 2020-11-06 ENCOUNTER — Encounter: Payer: Self-pay | Admitting: Urology

## 2020-11-06 ENCOUNTER — Ambulatory Visit (INDEPENDENT_AMBULATORY_CARE_PROVIDER_SITE_OTHER): Payer: BC Managed Care – PPO | Admitting: Urology

## 2020-11-06 ENCOUNTER — Other Ambulatory Visit: Payer: Self-pay

## 2020-11-06 VITALS — BP 118/90 | HR 84 | Temp 98.0°F

## 2020-11-06 DIAGNOSIS — C672 Malignant neoplasm of lateral wall of bladder: Secondary | ICD-10-CM | POA: Diagnosis not present

## 2020-11-06 MED ORDER — DIAZEPAM 10 MG PO TABS: 10.0000 mg | ORAL_TABLET | Freq: Once | ORAL | 0 refills | Status: AC

## 2020-11-06 MED ORDER — CIPROFLOXACIN HCL 500 MG PO TABS
500.0000 mg | ORAL_TABLET | Freq: Once | ORAL | Status: AC
  Administered 2020-11-06: 500 mg via ORAL

## 2020-11-06 NOTE — Progress Notes (Signed)
Urological Symptom Review  Patient is experiencing the following symptoms: Hard to postpone urination Burning/pain with urination Get up at night to urinate Blood in urine   Review of Systems  Gastrointestinal (upper)  : Negative for upper GI symptoms  Gastrointestinal (lower) : Negative for lower GI symptoms  Constitutional : Negative for symptoms  Skin: Negative for skin symptoms  Eyes: Negative for eye symptoms  Ear/Nose/Throat : Negative for Ear/Nose/Throat symptoms  Hematologic/Lymphatic: Negative for Hematologic/Lymphatic symptoms  Cardiovascular : Negative for cardiovascular symptoms  Respiratory : Negative for respiratory symptoms  Endocrine: Negative for endocrine symptoms  Musculoskeletal: Negative for musculoskeletal symptoms  Neurological: Negative for neurological symptoms  Psychologic: Negative for psychiatric symptoms

## 2020-11-06 NOTE — Patient Instructions (Signed)
Bladder Cancer Bladder cancer is a condition in which abnormal tissue (a tumor) grows in the bladder. The bladder is the organ that holds urine. Two tubes (ureters) carry the urine from the kidneys to the bladder. The bladder wall is made of layers of tissue. Cancer that spreads through these layers of the bladder wall becomes more difficult to treat. What are the causes? The cause of this condition is not known. What increases the risk? The following factors may make you more likely to develop this condition: Smoking. Working where there are risks (occupational exposures), such as working with rubber, leather, clothing fabric, dyes, chemicals, and paint. Being 40 years of age or older. Being male. Having bladder inflammation that is long-term (chronic). Having a history of cancer, including: A family history of bladder cancer. Personal experience with bladder cancer. Having had certain treatments for cancer before. These include: Medicines to kill cancer cells (chemotherapy). Strong X-ray beams or capsules high in energy to kill cancer cells and shrink tumors (radiation therapy). Having been exposed to arsenic. This is a chemical element that can poison you. What are the signs or symptoms? Early symptoms of this condition include: Seeing blood in your urine. Feeling pain when urinating. Having infections of your urinary system (urinary tract infections or UTIs) that happen often. Having to urinate sooner or more often than usual. Later symptoms of this condition include: Not being able to urinate. Pain on one side of your lower back. Loss of appetite. Weight loss. Tiredness (fatigue). Swelling in your feet. Bone pain. How is this diagnosed? This condition is diagnosed based on: Your medical history. A physical exam. Lab tests, such as urine tests. Imaging tests. Your symptoms. You may also have other tests or procedures done, such as: A cystoscopy. A narrow tube is inserted  into your bladder through the organ that connects your bladder to the outside of your body (urethra). This is done to view the lining of your bladder for tumors. A biopsy. This procedure involves removing a tissue sample to look at it under a microscope to see if cancer is present. It is important to find out: How deeply into the bladder wall cancer has grown. Whether cancer has spread to any other parts of your body. This may require blood tests or imaging tests, such as a CT scan, MRI, bone scan, or X-rays. How is this treated? Your health care provider may recommend one or more types of treatment based on the stage of your cancer. The most common types of treatment are: Surgery to remove the cancer. Procedures that may be done include: Removing a tumor on the inside wall of the bladder (transurethral resection). Removing the bladder (cystectomy). Radiation therapy. This is often used together with chemotherapy. Chemotherapy. Immunotherapy. This uses medicines to help your immune system destroy cancer cells. Follow these instructions at home: Take over-the-counter and prescription medicines only as told by your health care provider. Eat a healthy diet. Some of your treatments might affect your appetite. Do not use any products that contain nicotine or tobacco, such as cigarettes, e-cigarettes, and chewing tobacco. If you need help quitting, ask your health care provider. Consider joining a support group. This may help you learn to cope with the stress of having bladder cancer. Tell your cancer care team if you develop side effects. Your team may be able to recommend ways to get relief. Keep all follow-up visits as told by your health care provider. This is important. Where to find more information American   Cancer Society: www.cancer.org National Cancer Institute (NCI): www.cancer.gov Contact a health care provider if: You have symptoms of a urinary tract infection. These  include: Fever. Chills. Weakness. Muscle aches. Pain in your abdomen. Urge to urinate that is stronger and happens more often than usual. Burning feeling in the bladder or urethra when you urinate. Get help right away if: There is blood in your urine. You cannot urinate. You have severe pain or other symptoms that do not go away. Summary Bladder cancer is a condition in which tumors grow in the bladder and cause illness. This condition is diagnosed based on your medical history, a physical exam, lab tests, imaging tests, and your symptoms. Your health care provider may recommend one or more types of treatment based on the stage of your cancer. Consider joining a support group. This may help you learn to cope with the stress of having bladder cancer. This information is not intended to replace advice given to you by your health care provider. Make sure you discuss any questions you have with your health care provider. Document Revised: 01/05/2019 Document Reviewed: 01/05/2019 Elsevier Patient Education  2022 Elsevier Inc.  

## 2020-11-06 NOTE — Progress Notes (Signed)
   11/06/20  CC: followup after bladder tumor resection   HPI: Mr Ryan Conner is a 40yo here for left ureteral stent removal Blood pressure 118/90, pulse 84, temperature 98 F (36.7 C). NED. A&Ox3.   No respiratory distress   Abd soft, NT, ND Normal phallus with bilateral descended testicles  Cystoscopy Procedure Note  Patient identification was confirmed, informed consent was obtained, and patient was prepped using Betadine solution.  Lidocaine jelly was administered per urethral meatus.     Pre-Procedure: - Inspection reveals a normal caliber ureteral meatus.  Procedure: The flexible cystoscope was introduced without difficulty - No urethral strictures/lesions are present. - Normal prostate  - Normal bladder neck - Bilateral ureteral orifices identified - Bladder mucosa  reveals no ulcers, tumors, or lesions - No bladder stones - No trabeculation  Using a grasper the left ureteral stent was removed intact   Post-Procedure: - Patient tolerated the procedure well  Assessment/ Plan: RTC 3 months for cystoscopy and renal US  Nicolette Bang, MD

## 2021-01-30 ENCOUNTER — Other Ambulatory Visit: Payer: Self-pay

## 2021-01-30 ENCOUNTER — Ambulatory Visit (HOSPITAL_COMMUNITY)
Admission: RE | Admit: 2021-01-30 | Discharge: 2021-01-30 | Disposition: A | Discharge status: Home / Self Care | Payer: BC Managed Care – PPO | Source: Ambulatory Visit | Attending: Urology | Admitting: Urology

## 2021-01-30 DIAGNOSIS — C679 Malignant neoplasm of bladder, unspecified: Secondary | ICD-10-CM | POA: Diagnosis not present

## 2021-01-30 DIAGNOSIS — C672 Malignant neoplasm of lateral wall of bladder: Secondary | ICD-10-CM | POA: Insufficient documentation

## 2021-01-30 DIAGNOSIS — N1330 Unspecified hydronephrosis: Secondary | ICD-10-CM | POA: Diagnosis not present

## 2021-01-31 ENCOUNTER — Other Ambulatory Visit: Payer: Self-pay | Admitting: Urology

## 2021-01-31 ENCOUNTER — Telehealth: Payer: Self-pay

## 2021-01-31 MED ORDER — DIAZEPAM 10 MG PO TABS: 10.0000 mg | ORAL_TABLET | Freq: Once | ORAL | 0 refills | Status: AC

## 2021-01-31 NOTE — Telephone Encounter (Signed)
Patient returned call back.   Needing to discuss.  Cannot do procedure without volume.  Can be reached around 9 am.  Thanks, Helene Kelp

## 2021-01-31 NOTE — Telephone Encounter (Signed)
Patient calling to check if the volume has been ordered for procedure this coming Wed.  Needing a call back to let him know.  Call back: [phone]  Please advise.  Thanks, Helene Kelp

## 2021-01-31 NOTE — Telephone Encounter (Signed)
Returned call with no answer. Message left to return cal to office.

## 2021-01-31 NOTE — Telephone Encounter (Signed)
Returned call. Valium sent in to pharmacy per Dr. Alyson Ingles

## 2021-02-06 ENCOUNTER — Ambulatory Visit (INDEPENDENT_AMBULATORY_CARE_PROVIDER_SITE_OTHER): Payer: BC Managed Care – PPO | Admitting: Urology

## 2021-02-06 ENCOUNTER — Other Ambulatory Visit: Payer: Self-pay

## 2021-02-06 ENCOUNTER — Encounter: Payer: Self-pay | Admitting: Urology

## 2021-02-06 VITALS — BP 119/79 | HR 90

## 2021-02-06 DIAGNOSIS — R310 Gross hematuria: Secondary | ICD-10-CM

## 2021-02-06 DIAGNOSIS — C672 Malignant neoplasm of lateral wall of bladder: Secondary | ICD-10-CM | POA: Diagnosis not present

## 2021-02-06 LAB — URINALYSIS, ROUTINE W REFLEX MICROSCOPIC
Bilirubin, UA: NEGATIVE
Glucose, UA: NEGATIVE
Ketones, UA: NEGATIVE
Leukocytes,UA: NEGATIVE
Nitrite, UA: NEGATIVE
Protein,UA: NEGATIVE
RBC, UA: NEGATIVE
Specific Gravity, UA: 1.015 (ref 1.005–1.030)
Urobilinogen, Ur: 0.2 mg/dL (ref 0.2–1.0)
pH, UA: 6 (ref 5.0–7.5)

## 2021-02-06 MED ORDER — CIPROFLOXACIN HCL 500 MG PO TABS
500.0000 mg | ORAL_TABLET | Freq: Once | ORAL | Status: AC
  Administered 2021-02-06: 500 mg via ORAL

## 2021-02-06 NOTE — Progress Notes (Signed)

## 2021-02-06 NOTE — Progress Notes (Signed)
   02/06/21  CC: followup benign bladder tumor   HPI: Ryan Conner is a 40yo here for followup for a benign bladder tumor. No hematuria Blood pressure 119/79, pulse 90. NED. A&Ox3.   No respiratory distress   Abd soft, NT, ND Normal phallus with bilateral descended testicles  Cystoscopy Procedure Note  Patient identification was confirmed, informed consent was obtained, and patient was prepped using Betadine solution.  Lidocaine jelly was administered per urethral meatus.     Pre-Procedure: - Inspection reveals a normal caliber ureteral meatus.  Procedure: The flexible cystoscope was introduced without difficulty - No urethral strictures/lesions are present. - Normal prostate  - Normal bladder neck - Bilateral ureteral orifices identified - left lateral wall papillary tumor, 1cm - No bladder stones - No trabeculation    Post-Procedure: - Patient tolerated the procedure well  Assessment/ Plan:  Schedule for bladder tumor resection. Risks/benefits/alternatives discussed No follow-ups on file.  Nicolette Bang, MD

## 2021-02-06 NOTE — H&P (View-Only) (Signed)
   02/06/21  CC: followup benign bladder tumor   HPI: Ryan Conner is a 40yo here for followup for a benign bladder tumor. No hematuria Blood pressure 119/79, pulse 90. NED. A&Ox3.   No respiratory distress   Abd soft, NT, ND Normal phallus with bilateral descended testicles  Cystoscopy Procedure Note  Patient identification was confirmed, informed consent was obtained, and patient was prepped using Betadine solution.  Lidocaine jelly was administered per urethral meatus.     Pre-Procedure: - Inspection reveals a normal caliber ureteral meatus.  Procedure: The flexible cystoscope was introduced without difficulty - No urethral strictures/lesions are present. - Normal prostate  - Normal bladder neck - Bilateral ureteral orifices identified - left lateral wall papillary tumor, 1cm - No bladder stones - No trabeculation    Post-Procedure: - Patient tolerated the procedure well  Assessment/ Plan:  Schedule for bladder tumor resection. Risks/benefits/alternatives discussed No follow-ups on file.  Nicolette Bang, MD

## 2021-02-06 NOTE — Patient Instructions (Signed)
Transurethral Resection of Bladder Tumor Transurethral resection of a bladder tumor is the removal (resection) of a cancerous growth (tumor) on the inside wall of the bladder. The bladder is the organ that holds urine. The tumor is removed through the tube that carries urine out of the body (urethra). In a transurethral resection, a thin telescope with a light, a tiny camera, and an electric cutting edge (resectoscope) is passed through the urethra. In men, the opening of the urethra is at the end of the penis. In women, it is just above the opening of the vagina. Tell a health care provider about: Any allergies you have. All medicines you are taking, including vitamins, herbs, eye drops, creams, and over-the-counter medicines. Any problems you or family members have had with anesthetic medicines. Any blood disorders you have. Any surgeries you have had. Any medical conditions you have. Any recent urinary tract infections you have had. Whether you are pregnant or may be pregnant. What are the risks? Generally, this is a safe procedure. However, problems may occur, including: Infection. Bleeding. Allergic reactions to medicines. Damage to nearby structures or organs, such as: The urethra. The tubes that drain urine from the kidneys into the bladder (ureters). Pain and burning during urination. Difficulty urinating due to partial blockage of the urethra. Inability to urinate (urinary retention). What happens before the procedure? Staying hydrated Follow instructions from your health care provider about hydration, which may include: Up to 2 hours before the procedure - you may continue to drink clear liquids, such as water, clear fruit juice, black coffee, and plain tea.  Eating and drinking restrictions Follow instructions from your health care provider about eating and drinking, which may include: 8 hours before the procedure - stop eating heavy meals or foods, such as meat, fried foods,  or fatty foods. 6 hours before the procedure - stop eating light meals or foods, such as toast or cereal. 6 hours before the procedure - stop drinking milk or drinks that contain milk. 2 hours before the procedure - stop drinking clear liquids. Medicines Ask your health care provider about: Changing or stopping your regular medicines. This is especially important if you are taking diabetes medicines or blood thinners. Taking medicines such as aspirin and ibuprofen. These medicines can thin your blood. Do not take these medicines unless your health care provider tells you to take them. Taking over-the-counter medicines, vitamins, herbs, and supplements. Tests You may have exams or tests, including: Physical exam. Blood tests. Urine tests. Electrocardiogram (ECG). This test measures the electrical activity of the heart. General instructions Plan to have someone take you home from the hospital or clinic. Ask your health care provider how your surgical site will be marked or identified. Ask your health care provider what steps will be taken to help prevent infection. These may include: Washing skin with a germ-killing soap. Taking antibiotic medicine. What happens during the procedure? An IV will be inserted into one of your veins. You will be given one or more of the following: A medicine to help you relax (sedative). A medicine to make you fall asleep (general anesthetic). A medicine that is injected into your spine to numb the area below and slightly above the injection site (spinal anesthetic). Your legs will be placed in foot rests (stirrups) so that your legs are apart and your knees are bent. The resectoscope will be passed through your urethra and into your bladder. The part of your bladder that is affected by the tumor will be  resected using the cutting edge of the resectoscope. The resectoscope will be removed. A thin, flexible tube (catheter) will be passed through your urethra  and into your bladder. The catheter will drain urine into a bag outside of your body. Fluid may be passed through the catheter to keep the catheter open. The procedure may vary among health care providers and hospitals. What happens after the procedure? Your blood pressure, heart rate, breathing rate, and blood oxygen level will be monitored until you leave the hospital or clinic. You may continue to receive fluids and medicines through an IV. You will have some pain. You will be given pain medicine to relieve pain. You will have a catheter to drain your urine. You will have blood in your urine. Your catheter may be kept in until your urine is clear. The amount of urine will be monitored. If necessary, your bladder may be rinsed out (irrigated) by passing fluid through your catheter. You will be encouraged to walk around as soon as possible. You may have to wear compression stockings. These stockings help to prevent blood clots and reduce swelling in your legs. Do not drive for 24 hours if you were given a sedative during your procedure. Summary Transurethral resection of a bladder tumor is the removal (resection) of a cancerous growth (tumor) on the inside wall of the bladder. To do this procedure, your health care provider uses a thin telescope with a light, a tiny camera, and an electric cutting edge (resectoscope). Follow your health care provider's instructions. You may need to stop or change certain medicines, and you may be told to stop eating and drinking several hours before the procedure. Your blood pressure, heart rate, breathing rate, and blood oxygen level will be monitored until you leave the hospital or clinic. You may have to wear compression stockings. These stockings help to prevent blood clots and reduce swelling in your legs. This information is not intended to replace advice given to you by your health care provider. Make sure you discuss any questions you have with your  health care provider. Document Revised: 11/27/2017 Document Reviewed: 11/27/2017 [employer] Patient Education  [place].

## 2021-02-15 NOTE — Patient Instructions (Signed)
Your procedure is scheduled on: 02/21/2021  Report to [facility] at   6:45  AM.  Call this number if you have problems the morning of surgery: [phone]   Remember:   Do not Eat or Drink after midnight         No Smoking the morning of surgery  :  Take these medicines the morning of surgery with A SIP OF WATER: none   Do not wear jewelry, make-up or nail polish.  Do not wear lotions, powders, or perfumes. You may wear deodorant.  Do not shave 48 hours prior to surgery. Men may shave face and neck.  Do not bring valuables to the hospital.  Contacts, dentures or bridgework may not be worn into surgery.  Leave suitcase in the car. After surgery it may be brought to your room.  For patients admitted to the hospital, checkout time is 11:00 AM the day of discharge.   Patients discharged the day of surgery will not be allowed to drive home.    Special Instructions: Shower using CHG night before surgery and shower the day of surgery use CHG.  Use special wash - you have one bottle of CHG for all showers.  You should use approximately 1/2 of the bottle for each shower.  How to Use Chlorhexidine for Bathing Chlorhexidine gluconate (CHG) is a germ-killing (antiseptic) solution that is used to clean the skin. It can get rid of the bacteria that normally live on the skin and can keep them away for about 24 hours. To clean your skin with CHG, you may be given: A CHG solution to use in the shower or as part of a sponge bath. A prepackaged cloth that contains CHG. Cleaning your skin with CHG may help lower the risk for infection: While you are staying in the intensive care unit of the hospital. If you have a vascular access, such as a central line, to provide short-term or long-term access to your veins. If you have a catheter to drain urine from your bladder. If you are on a ventilator. A ventilator is a machine that helps you breathe by moving air in and out of your lungs. After  surgery. What are the risks? Risks of using CHG include: A skin reaction. Hearing loss, if CHG gets in your ears and you have a perforated eardrum. Eye injury, if CHG gets in your eyes and is not rinsed out. The CHG product catching fire. Make sure that you avoid smoking and flames after applying CHG to your skin. Do not use CHG: If you have a chlorhexidine allergy or have previously reacted to chlorhexidine. On babies younger than 65 months of age. How to use CHG solution Use CHG only as told by your health care provider, and follow the instructions on the label. Use the full amount of CHG as directed. Usually, this is one bottle. During a shower Follow these steps when using CHG solution during a shower (unless your health care provider gives you different instructions): Start the shower. Use your normal soap and shampoo to wash your face and hair. Turn off the shower or move out of the shower stream. Pour the CHG onto a clean washcloth. Do not use any type of brush or rough-edged sponge. Starting at your neck, lather your body down to your toes. Make sure you follow these instructions: If you will be having surgery, pay special attention to the part of your body where you will be having surgery. Scrub  this area for at least 1 minute. Do not use CHG on your head or face. If the solution gets into your ears or eyes, rinse them well with water. Avoid your genital area. Avoid any areas of skin that have broken skin, cuts, or scrapes. Scrub your back and under your arms. Make sure to wash skin folds. Let the lather sit on your skin for 1-2 minutes or as long as told by your health care provider. Thoroughly rinse your entire body in the shower. Make sure that all body creases and crevices are rinsed well. Dry off with a clean towel. Do not put any substances on your body afterward--such as powder, lotion, or perfume--unless you are told to do so by your health care provider. Only use lotions  that are recommended by the manufacturer. Put on clean clothes or pajamas. If it is the night before your surgery, sleep in clean sheets.  During a sponge bath Follow these steps when using CHG solution during a sponge bath (unless your health care provider gives you different instructions): Use your normal soap and shampoo to wash your face and hair. Pour the CHG onto a clean washcloth. Starting at your neck, lather your body down to your toes. Make sure you follow these instructions: If you will be having surgery, pay special attention to the part of your body where you will be having surgery. Scrub this area for at least 1 minute. Do not use CHG on your head or face. If the solution gets into your ears or eyes, rinse them well with water. Avoid your genital area. Avoid any areas of skin that have broken skin, cuts, or scrapes. Scrub your back and under your arms. Make sure to wash skin folds. Let the lather sit on your skin for 1-2 minutes or as long as told by your health care provider. Using a different clean, wet washcloth, thoroughly rinse your entire body. Make sure that all body creases and crevices are rinsed well. Dry off with a clean towel. Do not put any substances on your body afterward--such as powder, lotion, or perfume--unless you are told to do so by your health care provider. Only use lotions that are recommended by the manufacturer. Put on clean clothes or pajamas. If it is the night before your surgery, sleep in clean sheets. How to use CHG prepackaged cloths Only use CHG cloths as told by your health care provider, and follow the instructions on the label. Use the CHG cloth on clean, dry skin. Do not use the CHG cloth on your head or face unless your health care provider tells you to. When washing with the CHG cloth: Avoid your genital area. Avoid any areas of skin that have broken skin, cuts, or scrapes. Before surgery Follow these steps when using a CHG cloth to  clean before surgery (unless your health care provider gives you different instructions): Using the CHG cloth, vigorously scrub the part of your body where you will be having surgery. Scrub using a back-and-forth motion for 3 minutes. The area on your body should be completely wet with CHG when you are done scrubbing. Do not rinse. Discard the cloth and let the area air-dry. Do not put any substances on the area afterward, such as powder, lotion, or perfume. Put on clean clothes or pajamas. If it is the night before your surgery, sleep in clean sheets.  For general bathing Follow these steps when using CHG cloths for general bathing (unless your health care provider  gives you different instructions). Use a separate CHG cloth for each area of your body. Make sure you wash between any folds of skin and between your fingers and toes. Wash your body in the following order, switching to a new cloth after each step: The front of your neck, shoulders, and chest. Both of your arms, under your arms, and your hands. Your stomach and groin area, avoiding the genitals. Your right leg and foot. Your left leg and foot. The back of your neck, your back, and your buttocks. Do not rinse. Discard the cloth and let the area air-dry. Do not put any substances on your body afterward--such as powder, lotion, or perfume--unless you are told to do so by your health care provider. Only use lotions that are recommended by the manufacturer. Put on clean clothes or pajamas. Contact a health care provider if: Your skin gets irritated after scrubbing. You have questions about using your solution or cloth. You swallow any chlorhexidine. Call your local poison control center (1-(646)420-8384 in the [place] Get help right away if: Your eyes itch badly, or they become very red or swollen. Your skin itches badly and is red or swollen. Your hearing changes. You have trouble seeing. You have swelling or tingling in your mouth or  throat. You have trouble breathing. These symptoms may represent a serious problem that is an emergency. Do not wait to see if the symptoms will go away. Get medical help right away. Call your local emergency services (911 in the [place] Do not drive yourself to the hospital. Summary Chlorhexidine gluconate (CHG) is a germ-killing (antiseptic) solution that is used to clean the skin. Cleaning your skin with CHG may help to lower your risk for infection. You may be given CHG to use for bathing. It may be in a bottle or in a prepackaged cloth to use on your skin. Carefully follow your health care provider's instructions and the instructions on the product label. Do not use CHG if you have a chlorhexidine allergy. Contact your health care provider if your skin gets irritated after scrubbing. This information is not intended to replace advice given to you by your health care provider. Make sure you discuss any questions you have with your health care provider. Document Revised: 07/09/2020 Document Reviewed: 07/09/2020 Elsevier Patient Education  2022 [employer]. Transurethral Resection of Bladder Tumor, Care After This sheet gives you information about how to care for yourself after your procedure. Your health care provider may also give you more specific instructions. If you have problems or questions, contact your health care provider. What can I expect after the procedure? After the procedure, it is common to have: A small amount of blood in your urine for up to 2 weeks. Soreness or mild pain from your catheter. After your catheter is removed, you may have mild soreness, especially when urinating. Pain in your lower abdomen. Follow these instructions at home: Medicines  Take over-the-counter and prescription medicines only as told by your health care provider. If you were prescribed an antibiotic medicine, take it as told by your health care provider. Do not stop taking the antibiotic even if  you start to feel better. Do not drive for 24 hours if you were given a sedative during your procedure. Ask your health care provider if the medicine prescribed to you: Requires you to avoid driving or using heavy machinery. Can cause constipation. You may need to take these actions to prevent or treat constipation: Take over-the-counter or prescription medicines.  Eat foods that are high in fiber, such as beans, whole grains, and fresh fruits and vegetables. Limit foods that are high in fat and processed sugars, such as fried or sweet foods. Activity Return to your normal activities as told by your health care provider. Ask your health care provider what activities are safe for you. Do not lift anything that is heavier than 10 lb (4.5 kg), or the limit that you are told, until your health care provider says that it is safe. Avoid intense physical activity for as long as told by your health care provider. Rest as told by your health care provider. Avoid sitting for a long time without moving. Get up to take short walks every 1-2 hours. This is important to improve blood flow and breathing. Ask for help if you feel weak or unsteady. General instructions  Do not drink alcohol for as long as told by your health care provider. This is especially important if you are taking prescription pain medicines. Do not take baths, swim, or use a hot tub until your health care provider approves. Ask your health care provider if you may take showers. You may only be allowed to take sponge baths. If you have a catheter, follow instructions from your health care provider about caring for your catheter and your drainage bag. Drink enough fluid to keep your urine pale yellow. Wear compression stockings as told by your health care provider. These stockings help to prevent blood clots and reduce swelling in your legs. Keep all follow-up visits as told by your health care provider. This is important. You will need to  be followed closely with regular checks of your bladder and urethra (cystoscopies) to make sure that the cancer does not come back. Contact a health care provider if: You have pain that gets worse or does not improve with medicine. You have blood in your urine for more than 2 weeks. You have cloudy or bad-smelling urine. You become constipated. Signs of constipation may include having: Fewer than three bowel movements in a week. Difficulty having a bowel movement. Stools that are dry, hard, or larger than normal. You have a fever. Get help right away if: You have: Severe pain. Bright red blood in your urine. Blood clots in your urine. A lot of blood in your urine. Your catheter has been removed and you are not able to urinate. You have a catheter in place and the catheter is not draining urine. Summary After your procedure, it is common to have a small amount of blood in your urine, soreness or mild pain from your catheter, and pain in your lower abdomen. Take over-the-counter and prescription medicines only as told by your health care provider. Rest as told by your health care provider. Follow your health care provider's instructions about returning to normal activities. Ask what activities are safe for you. If you have a catheter, follow instructions from your health care provider about caring for your catheter and your drainage bag. Get help right away if you cannot urinate, you have severe pain, or you have bright red blood or blood clots in your urine. This information is not intended to replace advice given to you by your health care provider. Make sure you discuss any questions you have with your health care provider. Document Revised: 11/26/2017 Document Reviewed: 11/26/2017 Elsevier Patient Education  [place] Anesthesia, Adult, Care After This sheet gives you information about how to care for yourself after your procedure. Your health care  provider may also  give you more specific instructions. If you have problems or questions, contact your health care provider. What can I expect after the procedure? After the procedure, the following side effects are common: Pain or discomfort at the IV site. Nausea. Vomiting. Sore throat. Trouble concentrating. Feeling cold or chills. Feeling weak or tired. Sleepiness and fatigue. Soreness and body aches. These side effects can affect parts of the body that were not involved in surgery. Follow these instructions at home: For the time period you were told by your health care provider:  Rest. Do not participate in activities where you could fall or become injured. Do not drive or use machinery. Do not drink alcohol. Do not take sleeping pills or medicines that cause drowsiness. Do not make important decisions or sign legal documents. Do not take care of children on your own. Eating and drinking Follow any instructions from your health care provider about eating or drinking restrictions. When you feel hungry, start by eating small amounts of foods that are soft and easy to digest (bland), such as toast. Gradually return to your regular diet. Drink enough fluid to keep your urine pale yellow. If you vomit, rehydrate by drinking water, juice, or clear broth. General instructions If you have sleep apnea, surgery and certain medicines can increase your risk for breathing problems. Follow instructions from your health care provider about wearing your sleep device: Anytime you are sleeping, including during daytime naps. While taking prescription pain medicines, sleeping medicines, or medicines that make you drowsy. Have a responsible adult stay with you for the time you are told. It is important to have someone help care for you until you are awake and alert. Return to your normal activities as told by your health care provider. Ask your health care provider what activities are safe for you. Take  over-the-counter and prescription medicines only as told by your health care provider. If you smoke, do not smoke without supervision. Keep all follow-up visits as told by your health care provider. This is important. Contact a health care provider if: You have nausea or vomiting that does not get better with medicine. You cannot eat or drink without vomiting. You have pain that does not get better with medicine. You are unable to pass urine. You develop a skin rash. You have a fever. You have redness around your IV site that gets worse. Get help right away if: You have difficulty breathing. You have chest pain. You have blood in your urine or stool, or you vomit blood. Summary After the procedure, it is common to have a sore throat or nausea. It is also common to feel tired. Have a responsible adult stay with you for the time you are told. It is important to have someone help care for you until you are awake and alert. When you feel hungry, start by eating small amounts of foods that are soft and easy to digest (bland), such as toast. Gradually return to your regular diet. Drink enough fluid to keep your urine pale yellow. Return to your normal activities as told by your health care provider. Ask your health care provider what activities are safe for you. This information is not intended to replace advice given to you by your health care provider. Make sure you discuss any questions you have with your health care provider. Document Revised: 01/12/2020 Document Reviewed: 08/11/2019 Elsevier Patient Education  2022 [employer].

## 2021-02-19 ENCOUNTER — Encounter (HOSPITAL_COMMUNITY): Payer: Self-pay

## 2021-02-19 ENCOUNTER — Encounter (HOSPITAL_COMMUNITY)
Admission: RE | Admit: 2021-02-19 | Discharge: 2021-02-19 | Disposition: A | Discharge status: Home / Self Care | Payer: BC Managed Care – PPO | Source: Ambulatory Visit | Attending: Urology | Admitting: Urology

## 2021-02-19 ENCOUNTER — Other Ambulatory Visit: Payer: Self-pay

## 2021-02-19 DIAGNOSIS — Z86718 Personal history of other venous thrombosis and embolism: Secondary | ICD-10-CM | POA: Diagnosis not present

## 2021-02-19 DIAGNOSIS — Z01812 Encounter for preprocedural laboratory examination: Secondary | ICD-10-CM | POA: Insufficient documentation

## 2021-02-19 DIAGNOSIS — Z87891 Personal history of nicotine dependence: Secondary | ICD-10-CM | POA: Diagnosis not present

## 2021-02-19 DIAGNOSIS — E785 Hyperlipidemia, unspecified: Secondary | ICD-10-CM | POA: Diagnosis not present

## 2021-02-19 DIAGNOSIS — D494 Neoplasm of unspecified behavior of bladder: Secondary | ICD-10-CM | POA: Diagnosis not present

## 2021-02-19 DIAGNOSIS — D414 Neoplasm of uncertain behavior of bladder: Secondary | ICD-10-CM | POA: Diagnosis not present

## 2021-02-19 LAB — CBC
HCT: 43.3 % (ref 39.0–52.0)
Hemoglobin: 14.5 g/dL (ref 13.0–17.0)
MCH: 28.8 pg (ref 26.0–34.0)
MCHC: 33.5 g/dL (ref 30.0–36.0)
MCV: 86.1 fL (ref 80.0–100.0)
Platelets: 242 10*3/uL (ref 150–400)
RBC: 5.03 MIL/uL (ref 4.22–5.81)
RDW: 13.2 % (ref 11.5–15.5)
WBC: 6.3 10*3/uL (ref 4.0–10.5)
nRBC: 0 % (ref 0.0–0.2)

## 2021-02-21 ENCOUNTER — Encounter (HOSPITAL_COMMUNITY): Payer: Self-pay | Admitting: Urology

## 2021-02-21 ENCOUNTER — Ambulatory Visit (HOSPITAL_COMMUNITY)
Admission: RE | Admit: 2021-02-21 | Discharge: 2021-02-21 | Disposition: A | Discharge status: Home / Self Care | Payer: BC Managed Care – PPO | Attending: Urology | Admitting: Urology

## 2021-02-21 ENCOUNTER — Encounter (HOSPITAL_COMMUNITY)
Admission: RE | Disposition: A | Discharge status: Home / Self Care | Payer: Self-pay | Source: Home / Self Care | Attending: Urology

## 2021-02-21 ENCOUNTER — Ambulatory Visit (HOSPITAL_COMMUNITY): Payer: BC Managed Care – PPO | Admitting: Anesthesiology

## 2021-02-21 DIAGNOSIS — D414 Neoplasm of uncertain behavior of bladder: Secondary | ICD-10-CM | POA: Diagnosis not present

## 2021-02-21 DIAGNOSIS — Z87891 Personal history of nicotine dependence: Secondary | ICD-10-CM | POA: Insufficient documentation

## 2021-02-21 DIAGNOSIS — E785 Hyperlipidemia, unspecified: Secondary | ICD-10-CM | POA: Diagnosis not present

## 2021-02-21 DIAGNOSIS — D494 Neoplasm of unspecified behavior of bladder: Secondary | ICD-10-CM | POA: Diagnosis not present

## 2021-02-21 DIAGNOSIS — Z86718 Personal history of other venous thrombosis and embolism: Secondary | ICD-10-CM | POA: Diagnosis not present

## 2021-02-21 DIAGNOSIS — N400 Enlarged prostate without lower urinary tract symptoms: Secondary | ICD-10-CM | POA: Diagnosis not present

## 2021-02-21 DIAGNOSIS — D303 Benign neoplasm of bladder: Secondary | ICD-10-CM | POA: Diagnosis not present

## 2021-02-21 HISTORY — PX: CYSTOSCOPY: SHX5120

## 2021-02-21 HISTORY — PX: TRANSURETHRAL RESECTION OF BLADDER TUMOR: SHX2575

## 2021-02-21 SURGERY — CYSTOSCOPY
Anesthesia: General | Site: Bladder

## 2021-02-21 MED ORDER — FENTANYL CITRATE (PF) 100 MCG/2ML IJ SOLN
INTRAMUSCULAR | Status: AC
  Filled 2021-02-21: qty 2

## 2021-02-21 MED ORDER — SUCCINYLCHOLINE CHLORIDE 200 MG/10ML IV SOSY
PREFILLED_SYRINGE | INTRAVENOUS | Status: AC
  Filled 2021-02-21: qty 10

## 2021-02-21 MED ORDER — LIDOCAINE HCL (PF) 2 % IJ SOLN
INTRAMUSCULAR | Status: AC
  Filled 2021-02-21: qty 5

## 2021-02-21 MED ORDER — ORAL CARE MOUTH RINSE: 15.0000 mL | Freq: Once | OROMUCOSAL | Status: AC

## 2021-02-21 MED ORDER — DEXAMETHASONE SODIUM PHOSPHATE 4 MG/ML IJ SOLN
INTRAMUSCULAR | Status: DC | PRN
  Administered 2021-02-21: 10 mg via INTRAVENOUS

## 2021-02-21 MED ORDER — LIDOCAINE HCL (CARDIAC) PF 100 MG/5ML IV SOSY
PREFILLED_SYRINGE | INTRAVENOUS | Status: DC | PRN
  Administered 2021-02-21: 100 mg via INTRAVENOUS

## 2021-02-21 MED ORDER — STERILE WATER FOR IRRIGATION IR SOLN
Status: DC | PRN
  Administered 2021-02-21: 250 mL

## 2021-02-21 MED ORDER — MIDAZOLAM HCL 2 MG/2ML IJ SOLN
INTRAMUSCULAR | Status: AC
  Filled 2021-02-21: qty 2

## 2021-02-21 MED ORDER — ROCURONIUM BROMIDE 10 MG/ML (PF) SYRINGE
PREFILLED_SYRINGE | INTRAVENOUS | Status: AC
  Filled 2021-02-21: qty 10

## 2021-02-21 MED ORDER — LACTATED RINGERS IV SOLN: INTRAVENOUS | Status: DC

## 2021-02-21 MED ORDER — FENTANYL CITRATE (PF) 100 MCG/2ML IJ SOLN
INTRAMUSCULAR | Status: DC | PRN
  Administered 2021-02-21 (×4): 50 ug via INTRAVENOUS

## 2021-02-21 MED ORDER — MIDAZOLAM HCL 5 MG/5ML IJ SOLN
INTRAMUSCULAR | Status: DC | PRN
  Administered 2021-02-21: 2 mg via INTRAVENOUS

## 2021-02-21 MED ORDER — PROPOFOL 10 MG/ML IV BOLUS
INTRAVENOUS | Status: DC | PRN
  Administered 2021-02-21: 200 mg via INTRAVENOUS

## 2021-02-21 MED ORDER — SUCCINYLCHOLINE CHLORIDE 200 MG/10ML IV SOSY
PREFILLED_SYRINGE | INTRAVENOUS | Status: DC | PRN
  Administered 2021-02-21: 200 mg via INTRAVENOUS

## 2021-02-21 MED ORDER — PROPOFOL 10 MG/ML IV BOLUS
INTRAVENOUS | Status: AC
  Filled 2021-02-21: qty 20

## 2021-02-21 MED ORDER — HYDROMORPHONE HCL 1 MG/ML IJ SOLN: 0.2500 mg | INTRAMUSCULAR | Status: DC | PRN

## 2021-02-21 MED ORDER — DEXAMETHASONE SODIUM PHOSPHATE 10 MG/ML IJ SOLN
INTRAMUSCULAR | Status: AC
  Filled 2021-02-21: qty 1

## 2021-02-21 MED ORDER — ONDANSETRON HCL 4 MG/2ML IJ SOLN
INTRAMUSCULAR | Status: AC
  Filled 2021-02-21: qty 2

## 2021-02-21 MED ORDER — ONDANSETRON HCL 4 MG/2ML IJ SOLN
INTRAMUSCULAR | Status: DC | PRN
  Administered 2021-02-21: 4 mg via INTRAVENOUS

## 2021-02-21 MED ORDER — CEFAZOLIN SODIUM-DEXTROSE 2-4 GM/100ML-% IV SOLN
INTRAVENOUS | Status: AC
  Filled 2021-02-21: qty 100

## 2021-02-21 MED ORDER — SODIUM CHLORIDE 0.9 % IR SOLN
Status: DC | PRN
  Administered 2021-02-21 (×2): 3000 mL

## 2021-02-21 MED ORDER — CHLORHEXIDINE GLUCONATE 0.12 % MT SOLN
15.0000 mL | Freq: Once | OROMUCOSAL | Status: AC
  Administered 2021-02-21: 15 mL via OROMUCOSAL

## 2021-02-21 MED ORDER — CEFAZOLIN IN SODIUM CHLORIDE 3-0.9 GM/100ML-% IV SOLN
3.0000 g | INTRAVENOUS | Status: AC
  Administered 2021-02-21: 2 g via INTRAVENOUS

## 2021-02-21 MED ORDER — SUGAMMADEX SODIUM 200 MG/2ML IV SOLN
INTRAVENOUS | Status: DC | PRN
  Administered 2021-02-21: 200 mg via INTRAVENOUS

## 2021-02-21 MED ORDER — ROCURONIUM BROMIDE 100 MG/10ML IV SOLN
INTRAVENOUS | Status: DC | PRN
  Administered 2021-02-21: 10 mg via INTRAVENOUS
  Administered 2021-02-21: 40 mg via INTRAVENOUS

## 2021-02-21 MED ORDER — OXYCODONE-ACETAMINOPHEN 5-325 MG PO TABS: 1.0000 | ORAL_TABLET | ORAL | 0 refills | Status: DC | PRN

## 2021-02-21 MED ORDER — MEPERIDINE HCL 50 MG/ML IJ SOLN: 6.2500 mg | INTRAMUSCULAR | Status: DC | PRN

## 2021-02-21 MED ORDER — ONDANSETRON HCL 4 MG/2ML IJ SOLN: 4.0000 mg | Freq: Once | INTRAMUSCULAR | Status: DC | PRN

## 2021-02-21 SURGICAL SUPPLY — 23 items
BAG DRAIN URO TABLE W/ADPT NS (BAG) ×2 IMPLANT
BAG DRN 8 ADPR NS SKTRN CSTL (BAG) ×1
BAG DRN RND TRDRP ANRFLXCHMBR (UROLOGICAL SUPPLIES) ×1
BAG HAMPER (MISCELLANEOUS) ×2 IMPLANT
BAG URINE DRAIN 2000ML AR STRL (UROLOGICAL SUPPLIES) ×2 IMPLANT
CATH FOLEY 2WAY SLVR  5CC 18FR (CATHETERS) ×1
CATH FOLEY 2WAY SLVR 5CC 18FR (CATHETERS) ×1 IMPLANT
CLOTH BEACON ORANGE TIMEOUT ST (SAFETY) ×2 IMPLANT
ELECT LOOP 22F BIPOLAR SML (ELECTROSURGICAL) ×2
ELECTRODE LOOP 22F BIPOLAR SML (ELECTROSURGICAL) ×1 IMPLANT
GLOVE SURG POLYISO LF SZ8 (GLOVE) ×2 IMPLANT
GLOVE SURG UNDER POLY LF SZ7 (GLOVE) ×4 IMPLANT
GOWN STRL REUS W/TWL LRG LVL3 (GOWN DISPOSABLE) ×4 IMPLANT
GOWN STRL REUS W/TWL XL LVL3 (GOWN DISPOSABLE) ×2 IMPLANT
IV NS IRRIG 3000ML ARTHROMATIC (IV SOLUTION) ×4 IMPLANT
KIT TURNOVER CYSTO (KITS) ×2 IMPLANT
MANIFOLD NEPTUNE II (INSTRUMENTS) ×2 IMPLANT
PACK CYSTO (CUSTOM PROCEDURE TRAY) ×2 IMPLANT
PAD ARMBOARD 7.5X6 YLW CONV (MISCELLANEOUS) ×2 IMPLANT
SYR TOOMEY IRRIG 70ML (MISCELLANEOUS) ×2
SYRINGE TOOMEY IRRIG 70ML (MISCELLANEOUS) ×1 IMPLANT
TOWEL OR 17X26 4PK STRL BLUE (TOWEL DISPOSABLE) ×2 IMPLANT
WATER STERILE IRR 500ML POUR (IV SOLUTION) ×2 IMPLANT

## 2021-02-21 NOTE — Interval H&P Note (Signed)
History and Physical Interval Note:  02/21/2021 7:36 AM  Ryan Conner  has presented today for surgery, with the diagnosis of bladder tumor.  The various methods of treatment have been discussed with the patient and family. After consideration of risks, benefits and other options for treatment, the patient has consented to  Procedure(s): CYSTOSCOPY (N/A) TRANSURETHRAL RESECTION OF BLADDER TUMOR (TURBT) (N/A) as a surgical intervention.  The patient's history has been reviewed, patient examined, no change in status, stable for surgery.  I have reviewed the patient's chart and labs.  Questions were answered to the patient's satisfaction.     Nicolette Bang

## 2021-02-21 NOTE — Anesthesia Postprocedure Evaluation (Signed)
Anesthesia Post Note  Patient: Ryan Conner  Procedure(s) Performed: CYSTOSCOPY (Bladder) TRANSURETHRAL RESECTION OF BLADDER TUMOR (TURBT) (Bladder)  Patient location during evaluation: PACU Anesthesia Type: General Level of consciousness: awake and alert and oriented Pain management: pain level controlled Vital Signs Assessment: post-procedure vital signs reviewed and stable Respiratory status: spontaneous breathing and respiratory function stable Cardiovascular status: blood pressure returned to baseline and stable Postop Assessment: no apparent nausea or vomiting Anesthetic complications: no   No notable events documented.   Last Vitals:  Vitals:   02/21/21 0921 02/21/21 0929  BP:  (!) 141/102  Pulse: 60   Resp: 16   Temp: 36.6 C   SpO2: 100%     Last Pain:  Vitals:   02/21/21 0921  TempSrc: Oral  PainSc: 0-No pain                 Elvie Palomo C Shalee Paolo

## 2021-02-21 NOTE — Transfer of Care (Signed)
Immediate Anesthesia Transfer of Care Note  Patient: Ryan Conner  Procedure(s) Performed: CYSTOSCOPY (Bladder) TRANSURETHRAL RESECTION OF BLADDER TUMOR (TURBT) (Bladder)  Patient Location: PACU  Anesthesia Type:General  Level of Consciousness: awake, alert , oriented and patient cooperative  Airway & Oxygen Therapy: Patient Spontanous Breathing  Post-op Assessment: Report given to RN, Post -op Vital signs reviewed and stable and Patient moving all extremities X 4  Post vital signs: Reviewed and stable  Last Vitals:  Vitals Value Taken Time  BP    Temp    Pulse    Resp    SpO2      Last Pain:  Vitals:   02/21/21 0708  TempSrc: Oral         Complications: No notable events documented.

## 2021-02-21 NOTE — Anesthesia Procedure Notes (Addendum)
Procedure Name: Intubation Date/Time: 02/21/2021 7:48 AM Performed by: Tacy Learn, CRNA Pre-anesthesia Checklist: Patient identified, Emergency Drugs available, Suction available, Patient being monitored and Timeout performed Patient Re-evaluated:Patient Re-evaluated prior to induction Oxygen Delivery Method: Circle system utilized Preoxygenation: Pre-oxygenation with 100% oxygen Induction Type: IV induction Laryngoscope Size: Miller and 3 Grade View: Grade II Tube type: Oral Tube size: 7.5 mm Number of attempts: 1 Airway Equipment and Method: Stylet Placement Confirmation: ETT inserted through vocal cords under direct vision, positive ETCO2, CO2 detector and breath sounds checked- equal and bilateral Secured at: 23 cm Tube secured with: Tape Dental Injury: Teeth and Oropharynx as per pre-operative assessment

## 2021-02-21 NOTE — Op Note (Signed)
.  Preoperative diagnosis: bladder tumor  Postoperative diagnosis: Same  Procedure: 1 cystoscopy 2. Transurethral resection of bladder tumor, small  Attending: Rosie Fate  Anesthesia: General  Estimated blood loss: Minimal  Drains: 18 French foley  Specimens: bladder tumor  Antibiotics: ancef  Findings:  1cm papillary left lateral wall tumor.  Ureteral orifices in normal anatomic location.   Indications: Patient is a 40 year old male with a history of bladder tumor who developed a recurrence.  After discussing treatment options, they decided proceed with transurethral resection of a bladder tumor.  Procedure in detail: The patient was brought to the operating room and a brief timeout was done to ensure correct patient, correct procedure, correct site.  General anesthesia was administered patient was placed in dorsal lithotomy position.  Their genitalia was then prepped and draped in usual sterile fashion.  A rigid 67 French cystoscope was passed in the urethra and the bladder.  Bladder was inspected and we noted a 1cm bladder tumor. We then removed the cystoscope and placed a resectoscope into the bladder. Using the bipolar resectoscope we removed the bladder tumor down to the base. A subsequent muscle deep biopsy was then taken. Hemostasis was then obtained with electrocautery. We then removed the bladder tumor chips and sent them for pathology. We then re-inspected the bladder and found no residula bleeding.  the bladder was then drained, a 18 French foley was placed and this concluded the procedure which was well tolerated by patient.  Complications: None  Condition: Stable, extubated, transferred to PACU  Plan: Patient is to be discharged home and followup in 5 days for foley catheter removal and pathology discussion.

## 2021-02-21 NOTE — Anesthesia Preprocedure Evaluation (Signed)
Anesthesia Evaluation  Patient identified by MRN, date of birth, ID band Patient awake    Reviewed: Allergy & Precautions, NPO status , Patient's Chart, lab work & pertinent test results  History of Anesthesia Complications Negative for: history of anesthetic complications  Airway Mallampati: II  TM Distance: >3 FB Neck ROM: Full  Mouth opening: Limited Mouth Opening  Dental  (+) Dental Advisory Given, Teeth Intact   Pulmonary neg sleep apnea (snoring), former smoker,  Snoring    Pulmonary exam normal breath sounds clear to auscultation       Cardiovascular Exercise Tolerance: Good + DVT  Normal cardiovascular exam+ dysrhythmias (ablation )  Rhythm:Regular Rate:Normal  26-Mar-2020 14:14:11 [facility] ROUTINE RECORD Normal sinus rhythm Baseline wander Artifact Otherwise within normal limits Confirmed by Carmin Muskrat [phone]) on 03/26/2020 4:45:51 PM   Neuro/Psych  Headaches, negative psych ROS   GI/Hepatic negative GI ROS, Neg liver ROS,   Endo/Other  negative endocrine ROS  Renal/GU negative Renal ROS Bladder dysfunction (bladder tumor)      Musculoskeletal negative musculoskeletal ROS (+)   Abdominal   Peds  Hematology negative hematology ROS (+)   Anesthesia Other Findings Snoring   Reproductive/Obstetrics                             Anesthesia Physical  Anesthesia Plan  ASA: 2  Anesthesia Plan: General   Post-op Pain Management:    Induction: Intravenous  PONV Risk Score and Plan: Ondansetron and Dexamethasone  Airway Management Planned: Oral ETT and Video Laryngoscope Planned  Additional Equipment:   Intra-op Plan:   Post-operative Plan: Extubation in OR  Informed Consent: I have reviewed the patients History and Physical, chart, labs and discussed the procedure including the risks, benefits and alternatives for the proposed anesthesia with  the patient or authorized representative who has indicated his/her understanding and acceptance.       Plan Discussed with: CRNA and Surgeon  Anesthesia Plan Comments:         Anesthesia Quick Evaluation

## 2021-02-22 ENCOUNTER — Encounter (HOSPITAL_COMMUNITY): Payer: Self-pay | Admitting: Urology

## 2021-02-24 LAB — SURGICAL PATHOLOGY

## 2021-02-25 ENCOUNTER — Other Ambulatory Visit: Payer: Self-pay

## 2021-02-25 ENCOUNTER — Ambulatory Visit (INDEPENDENT_AMBULATORY_CARE_PROVIDER_SITE_OTHER): Payer: BC Managed Care – PPO

## 2021-02-25 DIAGNOSIS — C672 Malignant neoplasm of lateral wall of bladder: Secondary | ICD-10-CM

## 2021-02-25 NOTE — Progress Notes (Signed)
Fill and Pull Catheter Removal  Patient is present today for a catheter removal.  Patient was cleaned and prepped in a sterile fashion 275ml of sterile water/ saline was instilled into the bladder when the patient felt the urge to urinate. 34ml of water was then drained from the balloon.  A 18FR foley cath was removed from the bladder no complications were noted .  Patient as then given some time to void on their own.  Patient can void  266ml on their own after some time.  Patient tolerated well.  Performed by: Dhani Dannemiller LPN  Follow up/ Additional notes: Keep scheduled post op

## 2021-02-27 ENCOUNTER — Ambulatory Visit (INDEPENDENT_AMBULATORY_CARE_PROVIDER_SITE_OTHER): Payer: BC Managed Care – PPO | Admitting: Urology

## 2021-02-27 ENCOUNTER — Other Ambulatory Visit: Payer: Self-pay

## 2021-02-27 ENCOUNTER — Encounter: Payer: Self-pay | Admitting: Urology

## 2021-02-27 VITALS — BP 139/86 | HR 75 | Temp 98.6°F | Wt 269.0 lb

## 2021-02-27 DIAGNOSIS — C672 Malignant neoplasm of lateral wall of bladder: Secondary | ICD-10-CM | POA: Diagnosis not present

## 2021-02-27 DIAGNOSIS — D303 Benign neoplasm of bladder: Secondary | ICD-10-CM

## 2021-02-27 MED ORDER — DIAZEPAM 10 MG PO TABS: 10.0000 mg | ORAL_TABLET | Freq: Once | ORAL | 0 refills | Status: AC

## 2021-02-27 NOTE — Addendum Note (Signed)
Addended by: Cleon Gustin on: 02/27/2021 09:32 AM   Modules accepted: Orders

## 2021-02-27 NOTE — Progress Notes (Signed)

## 2021-02-27 NOTE — Progress Notes (Signed)
02/27/2021 9:25 AM   Yasuo Tawil Mar 21, 1981 [id number]  Referring provider: Dettinger, Fransisca Kaufmann, MD [place],  [place] [address]  Followup bladder tumor resection   HPI: Mr Granja is a 40yo here for followup after bladder tumor resection. Pathology benign. He denies any LUTS. No hematuria   PMH: Past Medical History:  Diagnosis Date   Arrhythmia    Deep vein thrombosis (DVT) (HCC)    Hyperlipidemia    Status post placement of implantable loop recorder 2014   [employer] Reveal LINQ - implanted in [place]    Surgical History: Past Surgical History:  Procedure Laterality Date   CYSTOSCOPY N/A 02/21/2021   Procedure: CYSTOSCOPY;  Surgeon: Cleon Gustin, MD;  Location: [facility];  Service: Urology;  Laterality: N/A;   CYSTOSCOPY W/ RETROGRADES Bilateral 10/15/2020   Procedure: CYSTOSCOPY WITH RETROGRADE PYELOGRAM;  Surgeon: Cleon Gustin, MD;  Location: [facility];  Service: Urology;  Laterality: Bilateral;   Implantable loop recorder  2014   KNEE SURGERY Right    LOOP RECORDER REMOVAL     TRANSURETHRAL RESECTION OF BLADDER TUMOR N/A 10/15/2020   Procedure: TRANSURETHRAL RESECTION OF BLADDER TUMOR (TURBT);  Surgeon: Cleon Gustin, MD;  Location: [facility];  Service: Urology;  Laterality: N/A;   TRANSURETHRAL RESECTION OF BLADDER TUMOR N/A 02/21/2021   Procedure: TRANSURETHRAL RESECTION OF BLADDER TUMOR (TURBT);  Surgeon: Cleon Gustin, MD;  Location: [facility];  Service: Urology;  Laterality: N/A;   varicose veins removed   2012   bilateral legs     Home Medications:  Allergies as of 02/27/2021   No Known Allergies      Medication List        Accurate as of February 27, 2021  9:25 AM. If you have any questions, ask your nurse or doctor.          oxyCODONE-acetaminophen 5-325 MG tablet Commonly known as: Percocet Take 1 tablet by mouth every 4 (four) hours as needed.        Allergies: No Known Allergies  Family History: Family History   Problem Relation Age of Onset   Hypertension Mother    Varicose Veins Mother    Meniere's disease Mother    Migraines Mother        used to get them alot more, also had vertigo    Alcoholism Paternal Grandfather     Social History:  reports that he quit smoking about 14 months ago. His smoking use included cigarettes. He started smoking about 28 years ago. He has a 12.50 pack-year smoking history. He has never used smokeless tobacco. He reports current alcohol use. He reports that he does not use drugs.  ROS: All other review of systems were reviewed and are negative except what is noted above in HPI  Physical Exam: BP 139/86   Pulse 75   Temp 98.6 F (37 C)   Wt 269 lb (122 kg)   BMI 36.48 kg/m   Constitutional:  Alert and oriented, No acute distress. HEENT: [place] AT, moist mucus membranes.  Trachea midline, no masses. Cardiovascular: No clubbing, cyanosis, or edema. Respiratory: Normal respiratory effort, no increased work of breathing. GI: Abdomen is soft, nontender, nondistended, no abdominal masses GU: No CVA tenderness.  Lymph: No cervical or inguinal lymphadenopathy. Skin: No rashes, bruises or suspicious lesions. Neurologic: Grossly intact, no focal deficits, moving all 4 extremities. Psychiatric: Normal mood and affect.  Laboratory Data: Lab Results  Component Value Date   WBC 6.3 02/19/2021  HGB 14.5 02/19/2021   HCT 43.3 02/19/2021   MCV 86.1 02/19/2021   PLT 242 02/19/2021    Lab Results  Component Value Date   CREATININE 0.81 10/11/2020    No results found for: PSA  No results found for: TESTOSTERONE  No results found for: HGBA1C  Urinalysis    Component Value Date/Time   COLORURINE YELLOW 01/23/2019 1030   APPEARANCEUR Clear 02/06/2021 1501   LABSPEC 1.019 01/23/2019 1030   PHURINE 5.0 01/23/2019 1030   GLUCOSEU Negative 02/06/2021 1501   HGBUR LARGE (A) 01/23/2019 1030   BILIRUBINUR Negative 02/06/2021 1501   KETONESUR NEGATIVE 01/23/2019  1030   PROTEINUR Negative 02/06/2021 1501   PROTEINUR NEGATIVE 01/23/2019 1030   NITRITE Negative 02/06/2021 1501   NITRITE NEGATIVE 01/23/2019 1030   LEUKOCYTESUR Negative 02/06/2021 1501   LEUKOCYTESUR NEGATIVE 01/23/2019 1030    Lab Results  Component Value Date   LABMICR Comment 02/06/2021   WBCUA None seen 09/21/2020   LABEPIT None seen 09/21/2020   MUCUS Present 09/21/2020   BACTERIA Few 09/21/2020    Pertinent Imaging:  No results found for this or any previous visit.  No results found for this or any previous visit.  No results found for this or any previous visit.  No results found for this or any previous visit.  Results for orders placed during the hospital encounter of 01/30/21  Ultrasound renal complete  Narrative CLINICAL DATA:  Malignant neoplasm of urinary bladder, left hydronephrosis. Trans urethral resection of bladder tumor 10/15/2020  EXAM: RENAL / URINARY TRACT ULTRASOUND COMPLETE  COMPARISON:  CT abdomen/pelvis 09/21/2020  FINDINGS: Right Kidney:  Renal measurements: 12.5 cm x 5.2 cm x 4.8 cm = volume: 163 mL. Echogenicity within normal limits. No mass or hydronephrosis visualized.  Left Kidney:  Renal measurements: 12.3 cm x 5.8 cm x 5.6 = volume: 208 mL. Echogenicity within normal limits. No mass or hydronephrosis visualized.  Bladder:  Appears normal for degree of bladder distention. Bilateral ureteral jets are seen.  Other:  None.  IMPRESSION: Normal renal ultrasound. Specifically, no evidence of left hydronephrosis.   Electronically Signed By: Valetta Mole M.D. On: 01/31/2021 13:49  No results found for this or any previous visit.  Results for orders placed during the hospital encounter of 09/21/20  CT HEMATURIA WORKUP  Narrative CLINICAL DATA:  Gross hematuria times 6-8 months with clots, history of nephrolithiasis  EXAM: CT ABDOMEN AND PELVIS WITHOUT AND WITH CONTRAST  TECHNIQUE: Multidetector CT  imaging of the abdomen and pelvis was performed following the standard protocol before and following the bolus administration of intravenous contrast.  CONTRAST:  137mL OMNIPAQUE IOHEXOL 300 MG/ML  SOLN  COMPARISON:  CT abdomen pelvis July 16, 2020  FINDINGS: Lower chest: No acute abnormality. Normal size heart. No significant pericardial effusion/thickening.  Hepatobiliary: No suspicious hepatic lesion. Gallbladder is unremarkable. No biliary ductal dilation.  Pancreas: Within normal limits.  Spleen: Within normal limits.  Adrenals/Urinary Tract: Bilateral adrenal glands are unremarkable.  No hydronephrosis. Symmetric enhancement excretion of contrast with bilateral kidneys. No suspicious filling defect visualized within the opacified portions of the collecting system or ureters on delayed imaging. However, the mid/distal right ureter is not opacified limiting evaluation.  No renal, ureteral or bladder calculi visualized. No solid enhancing renal masses.  In the posterior aspect of the left side of the urinary bladder there is a rounded 2.3 x 2.2 cm heterogeneous intermediate density lesion which does not demonstrate postcontrast enhancement on image 65/6 and 48/11.  Stomach/Bowel: Stomach is within normal limits. Appendix appears normal. No evidence of bowel wall thickening, distention, or inflammatory changes.  Vascular/Lymphatic: No significant vascular findings are present. No pathologically enlarged abdominal or pelvic lymph nodes.  Reproductive: Prostate is unremarkable.  Other: No abdominopelvic ascites.  Musculoskeletal: No aggressive lytic or blastic lesions of bone. No acute osseous abnormality.  IMPRESSION: 1. 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 Further evaluation  with cystoscopy is recommended. 2. No renal, ureteral, or bladder calculi visualized. No hydronephrosis. 3. No solid enhancing renal masses.   Electronically Signed By: Dahlia Bailiff MD On: 09/21/2020 19:13  Results for orders placed during the hospital encounter of 01/23/19  CT Renal Stone Study  Narrative CLINICAL DATA:  Hematuria. Flank pain. History of kidney stones.  EXAM: CT ABDOMEN AND PELVIS WITHOUT CONTRAST  TECHNIQUE: Multidetector CT imaging of the abdomen and pelvis was performed following the standard protocol without IV contrast.  COMPARISON:  None.  FINDINGS: Lower chest: Normal.  Hepatobiliary: No focal liver abnormality is seen. No gallstones, gallbladder wall thickening, or biliary dilatation.  Pancreas: Unremarkable. No pancreatic ductal dilatation or surrounding inflammatory changes.  Spleen: Normal in size without focal abnormality.  Adrenals/Urinary Tract: Adrenal glands are unremarkable. Kidneys are normal, without renal calculi, focal lesion, or hydronephrosis. Bladder is unremarkable.  Stomach/Bowel: Stomach is within normal limits. Appendix appears normal. No evidence of bowel wall thickening, distention, or inflammatory changes.  Vascular/Lymphatic: No significant vascular findings are present. No enlarged abdominal or pelvic lymph nodes.  Reproductive: Prostate is unremarkable.  Other: No abdominal wall hernia or abnormality. No abdominopelvic ascites.  Musculoskeletal: No acute or significant osseous findings.  IMPRESSION: Benign-appearing abdomen and pelvis.   Electronically Signed By: Lorriane Shire M.D. On: 01/23/2019 12:18   Assessment & Plan:    1. 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  Nicolette Bang, MD  [facility] [place]

## 2021-02-28 LAB — MICROSCOPIC EXAMINATION: Renal Epithel, UA: NONE SEEN /hpf

## 2021-02-28 LAB — URINALYSIS, ROUTINE W REFLEX MICROSCOPIC
Bilirubin, UA: NEGATIVE
Glucose, UA: NEGATIVE
Ketones, UA: NEGATIVE
Nitrite, UA: NEGATIVE
Specific Gravity, UA: >1.03 — ABNORMAL HIGH (ref 1.005–1.030)
Urobilinogen, Ur: 0.2 mg/dL (ref 0.2–1.0)
pH, UA: 5 (ref 5.0–7.5)

## 2021-03-06 ENCOUNTER — Ambulatory Visit: Payer: BC Managed Care – PPO | Admitting: Urology

## 2021-04-19 ENCOUNTER — Encounter: Payer: Self-pay | Admitting: Family Medicine

## 2021-04-19 ENCOUNTER — Ambulatory Visit (INDEPENDENT_AMBULATORY_CARE_PROVIDER_SITE_OTHER): Payer: BC Managed Care – PPO

## 2021-04-19 ENCOUNTER — Ambulatory Visit (INDEPENDENT_AMBULATORY_CARE_PROVIDER_SITE_OTHER): Payer: BC Managed Care – PPO | Admitting: Family Medicine

## 2021-04-19 VITALS — BP 122/87 | HR 65 | Ht 72.0 in | Wt 275.0 lb

## 2021-04-19 DIAGNOSIS — R0789 Other chest pain: Secondary | ICD-10-CM

## 2021-04-19 NOTE — Progress Notes (Signed)
BP 122/87   Pulse 65   Ht 6' (1.829 m)   Wt 275 lb (124.7 kg)   SpO2 94%   BMI 37.30 kg/m    Subjective:   Patient ID: Ryan Conner, male    DOB: 02-02-1981, 40 y.o.   MRN: [id number]  HPI: Ryan Conner is a 40 y.o. male presenting on 04/19/2021 for Abdominal Pain (LUQ)   HPI Patient is coming in today for left upper quadrant/left lower rib pain that is been going on over the past 3 months.  He says it happens every day and happens with certain movements and motions and it will be sharp and then he will feel numb sensation in the area.  He cannot recall any specific trauma or anything that happened there.  He denies any nausea or vomiting or difficulty with bowel movements or association with eating or food.  He denies any urinary complaints or fevers or chills.  He denies any difficulty breathing except for sometimes it does hurt to breathe and deep.  Relevant past medical, surgical, family and social history reviewed and updated as indicated. Interim medical history since our last visit reviewed. Allergies and medications reviewed and updated.  Review of Systems  Constitutional:  Negative for chills and fever.  Respiratory:  Negative for shortness of breath and wheezing.   Cardiovascular:  Positive for chest pain. Negative for palpitations and leg swelling.  Musculoskeletal:  Negative for arthralgias, back pain and gait problem.  Skin:  Negative for rash.  All other systems reviewed and are negative.  Per HPI unless specifically indicated above   Allergies as of 04/19/2021   No Known Allergies      Medication List        Accurate as of April 19, 2021  4:11 PM. If you have any questions, ask your nurse or doctor.          STOP taking these medications    oxyCODONE-acetaminophen 5-325 MG tablet Commonly known as: Percocet Stopped by: Fransisca Kaufmann [place]ano, MD         Objective:   BP 122/87   Pulse 65   Ht 6' (1.829 m)   Wt 275 lb (124.7 kg)   SpO2 94%    BMI 37.30 kg/m   Wt Readings from Last 3 Encounters:  04/19/21 275 lb (124.7 kg)  02/27/21 269 lb (122 kg)  02/19/21 270 lb (122.5 kg)    Physical Exam Vitals and nursing note reviewed.  Constitutional:      General: He is not in acute distress.    Appearance: He is well-developed. He is not diaphoretic.  Eyes:     General: No scleral icterus.       Right eye: No discharge.     Conjunctiva/sclera: Conjunctivae normal.     Pupils: Pupils are equal, round, and reactive to light.  Neck:     Thyroid: No thyromegaly.  Cardiovascular:     Rate and Rhythm: Normal rate and regular rhythm.     Heart sounds: Normal heart sounds. No murmur heard. Pulmonary:     Effort: Pulmonary effort is normal. No respiratory distress.     Breath sounds: Normal breath sounds. No wheezing.  Chest:     Chest wall: Tenderness (Left lower ribs chest wall tenderness, near the sternum and at the lower mid axillary ribs) present.  Musculoskeletal:        General: Normal range of motion.     Cervical back: Neck supple.  Lymphadenopathy:  Cervical: No cervical adenopathy.  Skin:    General: Skin is warm and dry.     Findings: No rash.  Neurological:     Mental Status: He is alert and oriented to person, place, and time.     Coordination: Coordination normal.  Psychiatric:        Behavior: Behavior normal.      Assessment & Plan:   Problem List Items Addressed This Visit   None Visit Diagnoses     Left-sided chest wall pain    -  Primary   Relevant Orders   DG Ribs Unilateral W/Chest Left       Will check x-ray and look at the lower ribs, concern for possible rib pathology versus nerve compression in the spine some may consider CT next. Follow up plan: Return if symptoms worsen or fail to improve.  Counseling provided for all of the vaccine components Orders Placed This Encounter  Procedures   DG Ribs Unilateral W/Chest Left    Caryl Pina, MD [facility] 04/19/2021, 4:11 PM

## 2021-04-23 ENCOUNTER — Telehealth: Payer: Self-pay | Admitting: Family Medicine

## 2021-04-23 DIAGNOSIS — R0789 Other chest pain: Secondary | ICD-10-CM

## 2021-04-23 NOTE — Telephone Encounter (Signed)
I will defer to PCP since he saw him in office and knows patient's symptoms.

## 2021-04-23 NOTE — Telephone Encounter (Signed)
Covering pcp.

## 2021-04-24 NOTE — Telephone Encounter (Signed)
Left-sided chest wall pain, did not improve with anti-inflammatories and conservative measures, history of bladder cancer so we will order CT chest x-ray was nonconclusive

## 2021-04-24 NOTE — Telephone Encounter (Signed)
Pt made aware that CT scan has been ordered. He would like an appt ASAP.  Informed pt that the scan was ordered today and will need authorized through his insurance.  Advised pt to call the office back on Friday if he does not have an appt scheduled.

## 2021-04-24 NOTE — Addendum Note (Signed)
Addended by: Caryl Pina on: 04/24/2021 07:50 AM   Modules accepted: Orders

## 2021-05-01 ENCOUNTER — Other Ambulatory Visit: Payer: Self-pay

## 2021-05-01 ENCOUNTER — Ambulatory Visit (HOSPITAL_COMMUNITY)
Admission: RE | Admit: 2021-05-01 | Discharge: 2021-05-01 | Disposition: A | Discharge status: Home / Self Care | Payer: BC Managed Care – PPO | Source: Ambulatory Visit | Attending: Family Medicine | Admitting: Family Medicine

## 2021-05-01 DIAGNOSIS — R0789 Other chest pain: Secondary | ICD-10-CM | POA: Insufficient documentation

## 2021-05-01 MED ORDER — IOHEXOL 350 MG/ML SOLN
60.0000 mL | Freq: Once | INTRAVENOUS | Status: AC | PRN
  Administered 2021-05-01: 09:00:00 60 mL via INTRAVENOUS

## 2021-05-01 MED ORDER — SODIUM CHLORIDE (PF) 0.9 % IJ SOLN
INTRAMUSCULAR | Status: AC
  Filled 2021-05-01: qty 50

## 2021-05-29 ENCOUNTER — Encounter: Payer: Self-pay | Admitting: Urology

## 2021-05-29 ENCOUNTER — Ambulatory Visit (INDEPENDENT_AMBULATORY_CARE_PROVIDER_SITE_OTHER): Payer: BC Managed Care – PPO | Admitting: Urology

## 2021-05-29 ENCOUNTER — Other Ambulatory Visit: Payer: Self-pay

## 2021-05-29 VITALS — BP 116/82 | HR 73

## 2021-05-29 DIAGNOSIS — R310 Gross hematuria: Secondary | ICD-10-CM

## 2021-05-29 DIAGNOSIS — D303 Benign neoplasm of bladder: Secondary | ICD-10-CM | POA: Diagnosis not present

## 2021-05-29 LAB — URINALYSIS, ROUTINE W REFLEX MICROSCOPIC
Bilirubin, UA: NEGATIVE
Glucose, UA: NEGATIVE
Ketones, UA: NEGATIVE
Leukocytes,UA: NEGATIVE
Nitrite, UA: NEGATIVE
Protein,UA: NEGATIVE
RBC, UA: NEGATIVE
Specific Gravity, UA: >1.03 — ABNORMAL HIGH (ref 1.005–1.030)
Urobilinogen, Ur: 0.2 mg/dL (ref 0.2–1.0)
pH, UA: 5.5 (ref 5.0–7.5)

## 2021-05-29 MED ORDER — CIPROFLOXACIN HCL 500 MG PO TABS
500.0000 mg | ORAL_TABLET | Freq: Once | ORAL | Status: AC
  Administered 2021-05-29: 500 mg via ORAL

## 2021-05-29 NOTE — Progress Notes (Signed)

## 2021-05-29 NOTE — Progress Notes (Signed)
° °  05/29/21  CC: followup benign bladder tumor   HPI: Ryan Conner is a 41yo here for followup for a benign bladder tumor Blood pressure 116/82, pulse 73. NED. A&Ox3.   No respiratory distress   Abd soft, NT, ND Normal phallus with bilateral descended testicles  Cystoscopy Procedure Note  Patient identification was confirmed, informed consent was obtained, and patient was prepped using Betadine solution.  Lidocaine jelly was administered per urethral meatus.     Pre-Procedure: - Inspection reveals a normal caliber ureteral meatus.  Procedure: The flexible cystoscope was introduced without difficulty - No urethral strictures/lesions are present. - Normal prostate  - Normal bladder neck - Bilateral ureteral orifices identified - Bladder mucosa  reveals no ulcers, tumors, or lesions - No bladder stones - No trabeculation     Post-Procedure: - Patient tolerated the procedure well  Assessment/ Plan: RTC 6 months for cystoscopy  No follow-ups on file.  Nicolette Bang, MD

## 2021-05-29 NOTE — Patient Instructions (Signed)
Cystoscopy Cystoscopy is a procedure that is used to help diagnose and sometimes treat conditions that affect the lower urinary tract. The lower urinary tract includes the bladder and the urethra. The urethra is the tube that drains urine from the bladder. Cystoscopy is done using a thin, tube-shaped instrument with a light and camera at the end (cystoscope). The cystoscope may be hard or flexible, depending on the goal of the procedure. The cystoscope is inserted through the urethra, into the bladder. Cystoscopy may be recommended if you have: Urinary tract infections that keep coming back. Blood in the urine (hematuria). An inability to control when you urinate (urinary incontinence) or an overactive bladder. Unusual cells found in a urine sample. A blockage in the urethra, such as a urinary stone. Painful urination. An abnormality in the bladder found during an intravenous pyelogram (IVP) or CT scan. Cystoscopy may also be done to remove a sample of tissue to be examined under a microscope (biopsy). Tell a health care provider about: Any allergies you have. All medicines you are taking, including vitamins, herbs, eye drops, creams, and over-the-counter medicines. Any problems you or family members have had with anesthetic medicines. Any blood disorders you have. Any surgeries you have had. Any medical conditions you have. Whether you are pregnant or may be pregnant. What are the risks? Generally, this is a safe procedure. However, problems may occur, including: Infection. Bleeding. Allergic reactions to medicines. Damage to other structures or organs. What happens before the procedure? Medicines Ask your health care provider about: Changing or stopping your regular medicines. This is especially important if you are taking diabetes medicines or blood thinners. Taking medicines such as aspirin and ibuprofen. These medicines can thin your blood. Do not take these medicines unless your  health care provider tells you to take them. Taking over-the-counter medicines, vitamins, herbs, and supplements. Tests You may have an exam or testing, such as: X-rays of the bladder, urethra, or kidneys. CT scan of the abdomen or pelvis. Urine tests to check for signs of infection. General instructions Follow instructions from your health care provider about eating or drinking restrictions. Ask your health care provider what steps will be taken to help prevent infection. These steps may include: Washing skin with a germ-killing soap. Taking antibiotic medicine. Plan to have a responsible adult take you home from the hospital or clinic. What happens during the procedure?  You will be given one or more of the following: A medicine to help you relax (sedative). A medicine to numb the area (local anesthetic). The area around the opening of your urethra will be cleaned. The cystoscope will be passed through your urethra into your bladder. Germ-free (sterile) fluid will flow through the cystoscope to fill your bladder. The fluid will stretch your bladder so that your health care provider can clearly examine your bladder walls. Your doctor will look at the urethra and bladder. Your doctor may take a biopsy or remove stones. The cystoscope will be removed, and your bladder will be emptied. The procedure may vary among health care providers and hospitals. What can I expect after the procedure? After the procedure, it is common to have: Some soreness or pain in your abdomen and urethra. Urinary symptoms. These include: Mild pain or burning when you urinate. Pain should stop within a few minutes after you urinate. This may last for up to 1 week. A small amount of blood in your urine for several days. Feeling like you need to urinate but producing only  a small amount of urine. Follow these instructions at home: Medicines Take over-the-counter and prescription medicines only as told by your  health care provider. If you were prescribed an antibiotic medicine, take it as told by your health care provider. Do not stop taking the antibiotic even if you start to feel better. General instructions Return to your normal activities as told by your health care provider. Ask your health care provider what activities are safe for you. If you were given a sedative during the procedure, it can affect you for several hours. Do not drive or operate machinery until your health care provider says that it is safe. Watch for any blood in your urine. If the amount of blood in your urine increases, call your health care provider. Follow instructions from your health care provider about eating or drinking restrictions. If a tissue sample was removed for testing (biopsy) during your procedure, it is up to you to get your test results. Ask your health care provider, or the department that is doing the test, when your results will be ready. Drink enough fluid to keep your urine pale yellow. Keep all follow-up visits. This is important. Contact a health care provider if: You have pain that gets worse or does not get better with medicine, especially pain when you urinate. You have trouble urinating. You have more blood in your urine. Get help right away if: You have blood clots in your urine. You have abdominal pain. You have a fever or chills. You are unable to urinate. Summary Cystoscopy is a procedure that is used to help diagnose and sometimes treat conditions that affect the lower urinary tract. Cystoscopy is done using a thin, tube-shaped instrument with a light and camera at the end. After the procedure, it is common to have some soreness or pain in your abdomen and urethra. Watch for any blood in your urine. If the amount of blood in your urine increases, call your health care provider. If you were prescribed an antibiotic medicine, take it as told by your health care provider. Do not stop taking  the antibiotic even if you start to feel better. This information is not intended to replace advice given to you by your health care provider. Make sure you discuss any questions you have with your health care provider. Document Revised: 01/09/2021 Document Reviewed: 12/09/2019 Elsevier Patient Education  [place].

## 2021-06-03 ENCOUNTER — Ambulatory Visit (HOSPITAL_COMMUNITY): Payer: BC Managed Care – PPO

## 2021-06-18 ENCOUNTER — Ambulatory Visit (INDEPENDENT_AMBULATORY_CARE_PROVIDER_SITE_OTHER): Payer: BC Managed Care – PPO | Admitting: Nurse Practitioner

## 2021-06-18 ENCOUNTER — Encounter: Payer: Self-pay | Admitting: Nurse Practitioner

## 2021-06-18 VITALS — BP 122/79 | HR 72 | Temp 97.7°F | Ht 72.0 in | Wt 275.0 lb

## 2021-06-18 DIAGNOSIS — R0981 Nasal congestion: Secondary | ICD-10-CM

## 2021-06-18 DIAGNOSIS — M791 Myalgia, unspecified site: Secondary | ICD-10-CM | POA: Diagnosis not present

## 2021-06-18 LAB — VERITOR FLU A/B WAIVED
Influenza A: NEGATIVE
Influenza B: NEGATIVE

## 2021-06-18 MED ORDER — NAPROXEN 500 MG PO TABS
500.0000 mg | ORAL_TABLET | Freq: Two times a day (BID) | ORAL | 1 refills | Status: DC
Start: 2021-06-18 — End: 2021-07-11

## 2021-06-18 NOTE — Patient Instructions (Signed)
1. Take meds as prescribed 2. Use a cool mist humidifier especially during the winter months and when heat has been humid. 3. Use saline nose sprays frequently 4. Saline irrigations of the nose can be very helpful if done frequently.  * 4X daily for 1 week*  * Use of a nettie pot can be helpful with this. Follow directions with this* 5. Drink plenty of fluids 6. Keep thermostat turn down low 7.For any cough or congestion- delsym if needed 8. For fever or aces or pains- take tylenol or ibuprofen appropriate for age and weight.  * for fevers greater than 101 orally you may alternate ibuprofen and tylenol every  3 hours.

## 2021-06-18 NOTE — Progress Notes (Signed)
Subjective:    Patient ID: Ryan Conner, male    DOB: 1981-01-29, 41 y.o.   MRN: [id number]  Chief Complaint: sinus congestion   URI  This is a new problem. The current episode started in the past 7 days. The problem has been waxing and waning. There has been no fever (had sweats on saturday night). Associated symptoms include congestion and rhinorrhea. Pertinent negatives include no chest pain, coughing (resolved), headaches, sneezing or sore throat. He has tried decongestant for the symptoms. The treatment provided mild relief.  He feels better other then the body aches.He has not been covid tested    Review of Systems  HENT:  Positive for congestion and rhinorrhea. Negative for sneezing and sore throat.   Respiratory:  Negative for cough (resolved).   Cardiovascular:  Negative for chest pain.  Neurological:  Negative for headaches.      Objective:   Physical Exam Vitals and nursing note reviewed.  Constitutional:      Appearance: Normal appearance. He is well-developed.  HENT:     Head: Normocephalic.     Nose: Nose normal.     Mouth/Throat:     Mouth: Mucous membranes are moist.     Pharynx: Oropharynx is clear.  Eyes:     Pupils: Pupils are equal, round, and reactive to light.  Neck:     Thyroid: No thyroid mass or thyromegaly.     Vascular: No carotid bruit or JVD.     Trachea: Phonation normal.  Cardiovascular:     Rate and Rhythm: Normal rate and regular rhythm.  Pulmonary:     Effort: Pulmonary effort is normal. No respiratory distress.     Breath sounds: Normal breath sounds.  Abdominal:     General: Bowel sounds are normal.     Palpations: Abdomen is soft.     Tenderness: There is no abdominal tenderness.  Musculoskeletal:        General: Normal range of motion.     Cervical back: Normal range of motion and neck supple.  Lymphadenopathy:     Cervical: No cervical adenopathy.  Skin:    General: Skin is warm and dry.  Neurological:     Mental Status:  He is alert and oriented to person, place, and time.  Psychiatric:        Behavior: Behavior normal.        Thought Content: Thought content normal.        Judgment: Judgment normal.    BP 122/79    Pulse 72    Temp 97.7 F (36.5 C)    Ht 6' (1.829 m)    Wt 275 lb (124.7 kg)    SpO2 97%    BMI 37.30 kg/m   Flu negative     Assessment & Plan:  Ryan Conner in today with chief complaint of sinus congestion   1. Sinus congestion 1. Take meds as prescribed 2. Use a cool mist humidifier especially during the winter months and when heat has been humid. 3. Use saline nose sprays frequently 4. Saline irrigations of the nose can be very helpful if done frequently.  * 4X daily for 1 week*  * Use of a nettie pot can be helpful with this. Follow directions with this* 5. Drink plenty of fluids 6. Keep thermostat turn down low 7.For any cough or congestion- delsym if needed 8. For fever or aces or pains- take tylenol or ibuprofen appropriate for age and weight.  * for fevers greater than  101 orally you may alternate ibuprofen and tylenol every  3 hours.    - Novel Coronavirus, NAA ([facility]) - Veritor Flu A/B Waived - naproxen (NAPROSYN) 500 MG tablet; Take 1 tablet (500 mg total) by mouth 2 (two) times daily with a meal.  Dispense: 60 tablet; Refill: 1  2. Myalgia Naprosyn as needed RTO prn    The above assessment and management plan was discussed with the patient. The patient verbalized understanding of and has agreed to the management plan. Patient is aware to call the clinic if symptoms persist or worsen. Patient is aware when to return to the clinic for a follow-up visit. Patient educated on when it is appropriate to go to the emergency department.   Mary-Margaret Hassell Done, FNP

## 2021-06-19 ENCOUNTER — Telehealth: Payer: Self-pay | Admitting: Family Medicine

## 2021-06-19 DIAGNOSIS — U071 COVID-19: Secondary | ICD-10-CM

## 2021-06-19 LAB — NOVEL CORONAVIRUS, NAA: SARS-CoV-2, NAA: DETECTED — AB

## 2021-06-19 LAB — SARS-COV-2, NAA 2 DAY TAT

## 2021-06-19 MED ORDER — MOLNUPIRAVIR EUA 200MG CAPSULE: 4.0000 | ORAL_CAPSULE | Freq: Two times a day (BID) | ORAL | 0 refills | Status: AC

## 2021-06-19 NOTE — Telephone Encounter (Signed)
COVID + please advise seen mmm

## 2021-06-19 NOTE — Telephone Encounter (Signed)
With his cancer history that does put him at a little higher risk.  I will send an antiviral for him and then recommend Flonase Mucinex and hydration

## 2021-06-20 NOTE — Telephone Encounter (Signed)
Left message informing pt. Asked pt to call back with any concerns.

## 2021-07-11 ENCOUNTER — Ambulatory Visit (INDEPENDENT_AMBULATORY_CARE_PROVIDER_SITE_OTHER): Payer: BC Managed Care – PPO | Admitting: Family Medicine

## 2021-07-11 ENCOUNTER — Encounter: Payer: Self-pay | Admitting: Family Medicine

## 2021-07-11 VITALS — BP 128/72 | HR 81 | Ht 72.0 in | Wt 273.0 lb

## 2021-07-11 DIAGNOSIS — E669 Obesity, unspecified: Secondary | ICD-10-CM | POA: Diagnosis not present

## 2021-07-11 DIAGNOSIS — R635 Abnormal weight gain: Secondary | ICD-10-CM | POA: Diagnosis not present

## 2021-07-11 MED ORDER — PHENTERMINE HCL 30 MG PO CAPS: 30.0000 mg | ORAL_CAPSULE | ORAL | 0 refills | Status: DC

## 2021-07-11 NOTE — Progress Notes (Signed)
? ?BP 128/72   Pulse 81   Ht 6' (1.829 m)   Wt 273 lb (123.8 kg)   SpO2 98%   BMI 37.03 kg/m?   ? ?Subjective:  ? ?Patient ID: Ryan Conner, male    DOB: Jan 22, 1981, 41 y.o.   MRN: [id number] ? ?HPI: ?Ryan Conner is a 41 y.o. male presenting on 07/11/2021 for Weight Gain (Would like to start on wt loss med) ? ? ?HPI ?Weight gain ?Patient says has been struggling over the past year with weight gain and appetite and calories.  He says is gained close to 80 pounds since he quit smoking about 8 months ago.  He says he is ready to try something to help himself.  He has tried different types of diets at home including doing a whole month of solids and exercises.  He is also tried to go on his wife's diet which is a gluten-free diet to see if that would help and he did not notice that much benefit out of that.  He denies any issues with mood or anxiety or depression.  He denies any difficulty sleeping.  He denies any troubles with hot or cold. ? ?Relevant past medical, surgical, family and social history reviewed and updated as indicated. Interim medical history since our last visit reviewed. ?Allergies and medications reviewed and updated. ? ?Review of Systems  ?Constitutional:  Positive for appetite change and unexpected weight change. Negative for chills and fever.  ?Eyes:  Negative for discharge.  ?Respiratory:  Negative for shortness of breath and wheezing.   ?Cardiovascular:  Negative for chest pain and leg swelling.  ?Musculoskeletal:  Negative for back pain and gait problem.  ?Skin:  Negative for rash.  ?Neurological:  Negative for dizziness, weakness and numbness.  ?All other systems reviewed and are negative. ? ?Per HPI unless specifically indicated above ? ? ?Allergies as of 07/11/2021   ?No Known Allergies ?  ? ?  ?Medication List  ?  ? ?  ? Accurate as of July 11, 2021 11:43 AM. If you have any questions, ask your nurse or doctor.  ?  ?  ? ?  ? ?STOP taking these medications   ? ?diazepam 10 MG  tablet ?Commonly known as: VALIUM ?Stopped by: Worthy Rancher, MD ?  ?naproxen 500 MG tablet ?Commonly known as: Naprosyn ?Stopped by: Worthy Rancher, MD ?  ? ?  ? ?TAKE these medications   ? ?phentermine 30 MG capsule ?Take 1 capsule (30 mg total) by mouth every morning. ?Started by: Worthy Rancher, MD ?  ? ?  ? ? ? ?Objective:  ? ?BP 128/72   Pulse 81   Ht 6' (1.829 m)   Wt 273 lb (123.8 kg)   SpO2 98%   BMI 37.03 kg/m?   ?Wt Readings from Last 3 Encounters:  ?07/11/21 273 lb (123.8 kg)  ?06/18/21 275 lb (124.7 kg)  ?04/19/21 275 lb (124.7 kg)  ?  ?Physical Exam ?Vitals and nursing note reviewed.  ?Constitutional:   ?   General: He is not in acute distress. ?   Appearance: He is well-developed. He is obese. He is not diaphoretic.  ?Eyes:  ?   General: No scleral icterus. ?   Conjunctiva/sclera: Conjunctivae normal.  ?Neck:  ?   Thyroid: No thyromegaly.  ?Cardiovascular:  ?   Rate and Rhythm: Normal rate and regular rhythm.  ?   Heart sounds: Normal heart sounds. No murmur heard. ?Pulmonary:  ?   Effort: Pulmonary  effort is normal. No respiratory distress.  ?   Breath sounds: Normal breath sounds. No wheezing.  ?Musculoskeletal:     ?   General: Normal range of motion.  ?   Cervical back: Neck supple.  ?Lymphadenopathy:  ?   Cervical: No cervical adenopathy.  ?Skin: ?   General: Skin is warm and dry.  ?   Findings: No rash.  ?Neurological:  ?   Mental Status: He is alert and oriented to person, place, and time.  ?   Coordination: Coordination normal.  ?Psychiatric:     ?   Behavior: Behavior normal.  ? ? ? ? ?Assessment & Plan:  ? ?Problem List Items Addressed This Visit   ?None ?Visit Diagnoses   ? ? Weight gain    -  Primary  ? Relevant Medications  ? phentermine 30 MG capsule  ? Other Relevant Orders  ? TSH  ? Obesity (BMI 30-39.9)      ? Relevant Medications  ? phentermine 30 MG capsule  ? Other Relevant Orders  ? TSH  ? ?  ?  ?We will check thyroid and try a short course of phentermine.  We  will see how he does and really focused on dietary modification and portion size and also discussed the Mediterranean diet. ?We also discussed having at least something like for breakfast so he does not in starvation mode throughout the day. ?Follow up plan: ?Return in about 4 weeks (around 08/08/2021), or if symptoms worsen or fail to improve, for Obesity and weight recheck. ? ?Counseling provided for all of the vaccine components ?Orders Placed This Encounter  ?Procedures  ? TSH  ? ? ?Caryl Pina, MD ?[place] ?07/11/2021, 11:43 AM ? ? ? ? ?

## 2021-07-12 LAB — TSH: TSH: 1.41 u[IU]/mL (ref 0.450–4.500)

## 2021-08-01 ENCOUNTER — Ambulatory Visit: Payer: BC Managed Care – PPO | Admitting: Family Medicine

## 2021-08-02 ENCOUNTER — Ambulatory Visit: Payer: BC Managed Care – PPO | Admitting: Family Medicine

## 2021-08-08 ENCOUNTER — Ambulatory Visit (INDEPENDENT_AMBULATORY_CARE_PROVIDER_SITE_OTHER): Payer: BC Managed Care – PPO | Admitting: Family Medicine

## 2021-08-08 ENCOUNTER — Encounter: Payer: Self-pay | Admitting: Family Medicine

## 2021-08-08 DIAGNOSIS — R635 Abnormal weight gain: Secondary | ICD-10-CM

## 2021-08-08 MED ORDER — PHENTERMINE HCL 30 MG PO CAPS: 30.0000 mg | ORAL_CAPSULE | ORAL | 0 refills | Status: DC

## 2021-08-08 NOTE — Progress Notes (Signed)
? ?BP (!) 128/92   Pulse (!) 55   Wt 265 lb (120.2 kg)   SpO2 97%   BMI 35.94 kg/m?   ? ?Subjective:  ? ?Patient ID: Ryan Conner, male    DOB: 13-Aug-1980, 41 y.o.   MRN: [id number] ? ?HPI: ?Trueman Worlds is a 41 y.o. male presenting on 08/08/2021 for Obesity ? ? ?HPI ?Obesity and weight management ?Patient been using phentermine for obesity and weight management.  He is down 8 pounds in the past month.  He denies any side effects from the medicine.  He says overall he is feeling good.  He is changing diet and increasing activity as well. ? ?Relevant past medical, surgical, family and social history reviewed and updated as indicated. Interim medical history since our last visit reviewed. ?Allergies and medications reviewed and updated. ? ?Review of Systems  ?Constitutional:  Negative for chills and fever.  ?Eyes:  Negative for visual disturbance.  ?Respiratory:  Negative for shortness of breath and wheezing.   ?Cardiovascular:  Negative for chest pain and leg swelling.  ?Musculoskeletal:  Negative for back pain and gait problem.  ?Skin:  Negative for rash.  ?Neurological:  Negative for dizziness, weakness and numbness.  ?All other systems reviewed and are negative. ? ?Per HPI unless specifically indicated above ? ? ?Allergies as of 08/08/2021   ?No Known Allergies ?  ? ?  ?Medication List  ?  ? ?  ? Accurate as of August 08, 2021  9:12 AM. If you have any questions, ask your nurse or doctor.  ?  ?  ? ?  ? ?phentermine 30 MG capsule ?Take 1 capsule (30 mg total) by mouth every morning. ?  ? ?  ? ? ? ?Objective:  ? ?BP (!) 128/92   Pulse (!) 55   Wt 265 lb (120.2 kg)   SpO2 97%   BMI 35.94 kg/m?   ?Wt Readings from Last 3 Encounters:  ?08/08/21 265 lb (120.2 kg)  ?07/11/21 273 lb (123.8 kg)  ?06/18/21 275 lb (124.7 kg)  ?  ?Physical Exam ?Vitals and nursing note reviewed.  ?Constitutional:   ?   General: He is not in acute distress. ?   Appearance: He is well-developed. He is not diaphoretic.  ?Eyes:  ?    General: No scleral icterus. ?   Conjunctiva/sclera: Conjunctivae normal.  ?Neck:  ?   Thyroid: No thyromegaly.  ?Cardiovascular:  ?   Rate and Rhythm: Normal rate and regular rhythm.  ?   Heart sounds: Normal heart sounds. No murmur heard. ?Pulmonary:  ?   Effort: Pulmonary effort is normal. No respiratory distress.  ?   Breath sounds: Normal breath sounds. No wheezing.  ?Musculoskeletal:  ?   Cervical back: Neck supple.  ?Lymphadenopathy:  ?   Cervical: No cervical adenopathy.  ?Skin: ?   General: Skin is warm and dry.  ?   Findings: No rash.  ?Neurological:  ?   Mental Status: He is alert and oriented to person, place, and time.  ?   Coordination: Coordination normal.  ?Psychiatric:     ?   Behavior: Behavior normal.  ? ? ? ? ?Assessment & Plan:  ? ?Problem List Items Addressed This Visit   ?None ?Visit Diagnoses   ? ? Weight gain      ? Relevant Medications  ? phentermine 30 MG capsule  ? ?  ?  ?Doing good on weight loss, change in diet, has not added exercise bike encouraged to add exercise  back and will continue medicine for another month. ?Follow up plan: ?Return in about 4 weeks (around 09/05/2021), or if symptoms worsen or fail to improve, for Obesity recheck. ? ?Counseling provided for all of the vaccine components ?No orders of the defined types were placed in this encounter. ? ? ?Caryl Pina, MD ?[place] ?08/08/2021, 9:12 AM ? ? ? ? ?

## 2021-09-11 ENCOUNTER — Encounter: Payer: Self-pay | Admitting: Family Medicine

## 2021-09-11 ENCOUNTER — Ambulatory Visit (INDEPENDENT_AMBULATORY_CARE_PROVIDER_SITE_OTHER): Payer: BC Managed Care – PPO | Admitting: Family Medicine

## 2021-09-11 DIAGNOSIS — R635 Abnormal weight gain: Secondary | ICD-10-CM

## 2021-09-11 MED ORDER — PHENTERMINE HCL 30 MG PO CAPS: 30.0000 mg | ORAL_CAPSULE | ORAL | 0 refills | Status: DC

## 2021-09-11 NOTE — Progress Notes (Signed)
? ?BP 127/89   Pulse 73   Ht 6' (1.829 m)   Wt 261 lb (118.4 kg)   SpO2 95%   BMI 35.40 kg/m?   ? ?Subjective:  ? ?Patient ID: Ryan Conner, male    DOB: 1980-08-27, 41 y.o.   MRN: [id number] ? ?HPI: ?Ryan Conner is a 41 y.o. male presenting on 09/11/2021 for Medical Management of Chronic Issues and Weight Check (Phentermine refill) ? ? ?HPI ?Weight Check ?Check.  He says he did have a period over the past month where he went on a trip for 2 weeks and forgot to take his phentermine within and did eat a little differently and that is why he has not lost as much but he still feels like he is refocusing and he is exercising at least 15 minutes every day running on a treadmill and and he is trying to work on portion sizing and not eating late at night he is doing pretty good at that. ? ?Relevant past medical, surgical, family and social history reviewed and updated as indicated. Interim medical history since our last visit reviewed. ?Allergies and medications reviewed and updated. ? ?Review of Systems  ?Constitutional:  Negative for chills and fever.  ?Eyes:  Negative for visual disturbance.  ?Respiratory:  Negative for shortness of breath and wheezing.   ?Cardiovascular:  Negative for chest pain and leg swelling.  ?Musculoskeletal:  Negative for back pain and gait problem.  ?Skin:  Negative for rash.  ?Neurological:  Negative for dizziness, weakness and light-headedness.  ?All other systems reviewed and are negative. ? ?Per HPI unless specifically indicated above ? ? ?Allergies as of 09/11/2021   ?No Known Allergies ?  ? ?  ?Medication List  ?  ? ?  ? Accurate as of Sep 11, 2021  9:42 AM. If you have any questions, ask your nurse or doctor.  ?  ?  ? ?  ? ?phentermine 30 MG capsule ?Take 1 capsule (30 mg total) by mouth every morning. ?Start taking on: Sep 23, 2021 ?What changed: These instructions start on Sep 23, 2021. If you are unsure what to do until then, ask your doctor or other care provider. ?Changed by:  Worthy Rancher, MD ?  ? ?  ? ? ? ?Objective:  ? ?BP 127/89   Pulse 73   Ht 6' (1.829 m)   Wt 261 lb (118.4 kg)   SpO2 95%   BMI 35.40 kg/m?   ?Wt Readings from Last 3 Encounters:  ?09/11/21 261 lb (118.4 kg)  ?08/08/21 265 lb (120.2 kg)  ?07/11/21 273 lb (123.8 kg)  ?  ?Physical Exam ?Vitals and nursing note reviewed.  ?Constitutional:   ?   Appearance: He is well-developed.  ?Eyes:  ?   General: No scleral icterus. ?Neck:  ?   Thyroid: No thyromegaly.  ?Neurological:  ?   Mental Status: He is oriented to person, place, and time.  ?   Coordination: Coordination normal.  ?Psychiatric:     ?   Behavior: Behavior normal.  ? ? ? ? ?Assessment & Plan:  ? ?Problem List Items Addressed This Visit   ?None ?Visit Diagnoses   ? ? Weight gain      ? Relevant Medications  ? phentermine 30 MG capsule (Start on 09/23/2021)  ? ?  ? Continue phentermine and recheck in 6 weeks ? ?Follow up plan: ?Return in about 6 weeks (around 10/23/2021), or if symptoms worsen or fail to improve, for weight check. ? ?  Counseling provided for all of the vaccine components ?No orders of the defined types were placed in this encounter. ? ? ?Caryl Pina, MD ?[place] ?09/11/2021, 9:42 AM ? ? ?  ?

## 2021-09-23 ENCOUNTER — Telehealth: Payer: Self-pay | Admitting: Family Medicine

## 2021-09-23 DIAGNOSIS — R635 Abnormal weight gain: Secondary | ICD-10-CM

## 2021-09-23 NOTE — Telephone Encounter (Signed)
Can we call the pharmacy and see if he filled it then, this is a controlled substance and he would need to be seen to get any refills. ?

## 2021-09-24 ENCOUNTER — Other Ambulatory Visit: Payer: Self-pay

## 2021-09-24 DIAGNOSIS — R635 Abnormal weight gain: Secondary | ICD-10-CM

## 2021-09-24 MED ORDER — PHENTERMINE HCL 30 MG PO CAPS: 30.0000 mg | ORAL_CAPSULE | ORAL | 0 refills | Status: DC

## 2021-09-24 NOTE — Telephone Encounter (Signed)
Per [facility] pt has not filled the phentermine since March. Prescription that we sent on 5/15 was denied. Not sure why, however a new Rx has been printed to sign and is on Dettinger's desk. [facility] [place]. Dr. Warrick Parisian is off today so Rx could not be eRx'd.  ? ?Left message informing pt that if ok with him we will wait until Dettinger returns on Wednesday to fax. Advised pt to call back with any concerns. ?

## 2021-09-25 ENCOUNTER — Telehealth: Payer: Self-pay

## 2021-09-25 ENCOUNTER — Encounter: Payer: Self-pay | Admitting: *Deleted

## 2021-09-25 NOTE — Telephone Encounter (Signed)
Opened in error

## 2021-10-10 ENCOUNTER — Ambulatory Visit: Payer: BC Managed Care – PPO

## 2021-10-25 ENCOUNTER — Ambulatory Visit: Payer: BC Managed Care – PPO | Admitting: Family Medicine

## 2021-11-07 ENCOUNTER — Ambulatory Visit: Payer: BC Managed Care – PPO | Admitting: Family Medicine

## 2021-11-25 ENCOUNTER — Telehealth: Payer: Self-pay

## 2021-11-25 NOTE — Telephone Encounter (Signed)
Patient would like something for his cystoscopy procedure on Wednesday please

## 2021-11-25 NOTE — Telephone Encounter (Signed)
Patient is asking if Dr. Alyson Ingles is going to call him in something for him to take before his appointment on Wednesday 11/27/21.  PATIENT USES [facility]

## 2021-11-26 ENCOUNTER — Other Ambulatory Visit: Payer: Self-pay | Admitting: Urology

## 2021-11-26 MED ORDER — DIAZEPAM 10 MG PO TABS: 10.0000 mg | ORAL_TABLET | Freq: Once | ORAL | 0 refills | Status: AC

## 2021-11-27 ENCOUNTER — Encounter: Payer: Self-pay | Admitting: Urology

## 2021-11-27 ENCOUNTER — Ambulatory Visit (INDEPENDENT_AMBULATORY_CARE_PROVIDER_SITE_OTHER): Payer: BC Managed Care – PPO | Admitting: Urology

## 2021-11-27 VITALS — BP 122/76 | HR 63

## 2021-11-27 DIAGNOSIS — D494 Neoplasm of unspecified behavior of bladder: Secondary | ICD-10-CM | POA: Diagnosis not present

## 2021-11-27 DIAGNOSIS — C672 Malignant neoplasm of lateral wall of bladder: Secondary | ICD-10-CM

## 2021-11-27 LAB — URINALYSIS, ROUTINE W REFLEX MICROSCOPIC
Bilirubin, UA: NEGATIVE
Glucose, UA: NEGATIVE
Ketones, UA: NEGATIVE
Leukocytes,UA: NEGATIVE
Nitrite, UA: NEGATIVE
Protein,UA: NEGATIVE
RBC, UA: NEGATIVE
Specific Gravity, UA: 1.025 (ref 1.005–1.030)
Urobilinogen, Ur: 0.2 mg/dL (ref 0.2–1.0)
pH, UA: 5 (ref 5.0–7.5)

## 2021-11-27 MED ORDER — CIPROFLOXACIN HCL 500 MG PO TABS
500.0000 mg | ORAL_TABLET | Freq: Once | ORAL | Status: AC
  Administered 2021-11-27: 500 mg via ORAL

## 2021-11-27 NOTE — Progress Notes (Signed)
   11/27/21  CC: follow bladder tumor   HPI: Ryan Conner is a 41yo here for followup for a benign bladder tumor Blood pressure 122/76, pulse 63. NED. A&Ox3.   No respiratory distress   Abd soft, NT, ND Normal phallus with bilateral descended testicles  Cystoscopy Procedure Note  Patient identification was confirmed, informed consent was obtained, and patient was prepped using Betadine solution.  Lidocaine jelly was administered per urethral meatus.     Pre-Procedure: - Inspection reveals a normal caliber ureteral meatus.  Procedure: The flexible cystoscope was introduced without difficulty - No urethral strictures/lesions are present. - Normal prostate  - Normal bladder neck - Bilateral ureteral orifices identified - Bladder mucosa  reveals no ulcers, tumors, or lesions - No bladder stones - No trabeculation    Post-Procedure: - Patient tolerated the procedure well  Assessment/ Plan:  RTC 6 months for cystoscopy No follow-ups on file.  Nicolette Bang, MD

## 2022-05-15 DIAGNOSIS — R059 Cough, unspecified: Secondary | ICD-10-CM | POA: Diagnosis not present

## 2022-05-15 DIAGNOSIS — R051 Acute cough: Secondary | ICD-10-CM | POA: Diagnosis not present

## 2022-05-27 ENCOUNTER — Telehealth: Payer: Self-pay

## 2022-05-27 NOTE — Telephone Encounter (Signed)
Patient requesting a valium be sent to [facility] pharmacy prior to his Cysto apt on 01/18.

## 2022-05-28 ENCOUNTER — Other Ambulatory Visit: Payer: Self-pay | Admitting: Urology

## 2022-05-28 MED ORDER — DIAZEPAM 10 MG PO TABS: 10.0000 mg | ORAL_TABLET | Freq: Once | ORAL | 0 refills | Status: DC

## 2022-05-28 NOTE — Telephone Encounter (Signed)
Rx sent by MD.

## 2022-05-29 ENCOUNTER — Telehealth: Payer: Self-pay

## 2022-05-29 NOTE — Telephone Encounter (Signed)
I called and left message medication available at pharmacy for pick up

## 2022-05-29 NOTE — Telephone Encounter (Signed)
Patient left a voice message 05-28-22.  Checking the status of the 1 pill being called into pharmacy for vasectomy on Fri.  Patient would like a call back at:  [phone].  Thanks, Helene Kelp

## 2022-05-30 ENCOUNTER — Ambulatory Visit (INDEPENDENT_AMBULATORY_CARE_PROVIDER_SITE_OTHER): Payer: BC Managed Care – PPO | Admitting: Urology

## 2022-05-30 ENCOUNTER — Encounter: Payer: Self-pay | Admitting: Urology

## 2022-05-30 VITALS — BP 112/75 | HR 69

## 2022-05-30 DIAGNOSIS — D303 Benign neoplasm of bladder: Secondary | ICD-10-CM | POA: Diagnosis not present

## 2022-05-30 DIAGNOSIS — C672 Malignant neoplasm of lateral wall of bladder: Secondary | ICD-10-CM

## 2022-05-30 LAB — MICROSCOPIC EXAMINATION
Bacteria, UA: NONE SEEN
WBC, UA: NONE SEEN /hpf (ref 0–5)

## 2022-05-30 LAB — URINALYSIS, ROUTINE W REFLEX MICROSCOPIC
Bilirubin, UA: NEGATIVE
Glucose, UA: NEGATIVE
Ketones, UA: NEGATIVE
Leukocytes,UA: NEGATIVE
Nitrite, UA: NEGATIVE
Protein,UA: NEGATIVE
Specific Gravity, UA: 1.025 (ref 1.005–1.030)
Urobilinogen, Ur: 0.2 mg/dL (ref 0.2–1.0)
pH, UA: 5.5 (ref 5.0–7.5)

## 2022-05-30 MED ORDER — CIPROFLOXACIN HCL 500 MG PO TABS
500.0000 mg | ORAL_TABLET | Freq: Once | ORAL | Status: AC
  Administered 2022-05-30: 500 mg via ORAL

## 2022-05-30 NOTE — Progress Notes (Signed)
   05/30/22  CC: followup benign bladder tumor   HPI: Ryan Conner is a 42yo here for followup for bengin bladder tumor Blood pressure 112/75, pulse 69. NED. A&Ox3.   No respiratory distress   Abd soft, NT, ND Normal phallus with bilateral descended testicles  Cystoscopy Procedure Note  Patient identification was confirmed, informed consent was obtained, and patient was prepped using Betadine solution.  Lidocaine jelly was administered per urethral meatus.     Pre-Procedure: - Inspection reveals a normal caliber ureteral meatus.  Procedure: The flexible cystoscope was introduced without difficulty - No urethral strictures/lesions are present. - Normal prostate  - Normal bladder neck - Bilateral ureteral orifices identified - Bladder mucosa  reveals no ulcers, tumors, or lesions - No bladder stones - No trabeculation  Retroflexion shows no intravesical prostatic protrusion   Post-Procedure: - Patient tolerated the procedure well  Assessment/ Plan: Followup 6 months for cystoscopy  No follow-ups on file.  Nicolette Bang, MD

## 2022-06-03 ENCOUNTER — Ambulatory Visit: Payer: BC Managed Care – PPO | Admitting: Nurse Practitioner

## 2022-06-03 ENCOUNTER — Encounter: Payer: Self-pay | Admitting: Family Medicine

## 2022-06-07 IMAGING — DX DG CHEST 2V
2 series · 2 of 2 positions shown · non-contrast
Comparison: None.

CLINICAL DATA: Chest pain

EXAM:
CHEST - 2 VIEW

[chest pa]
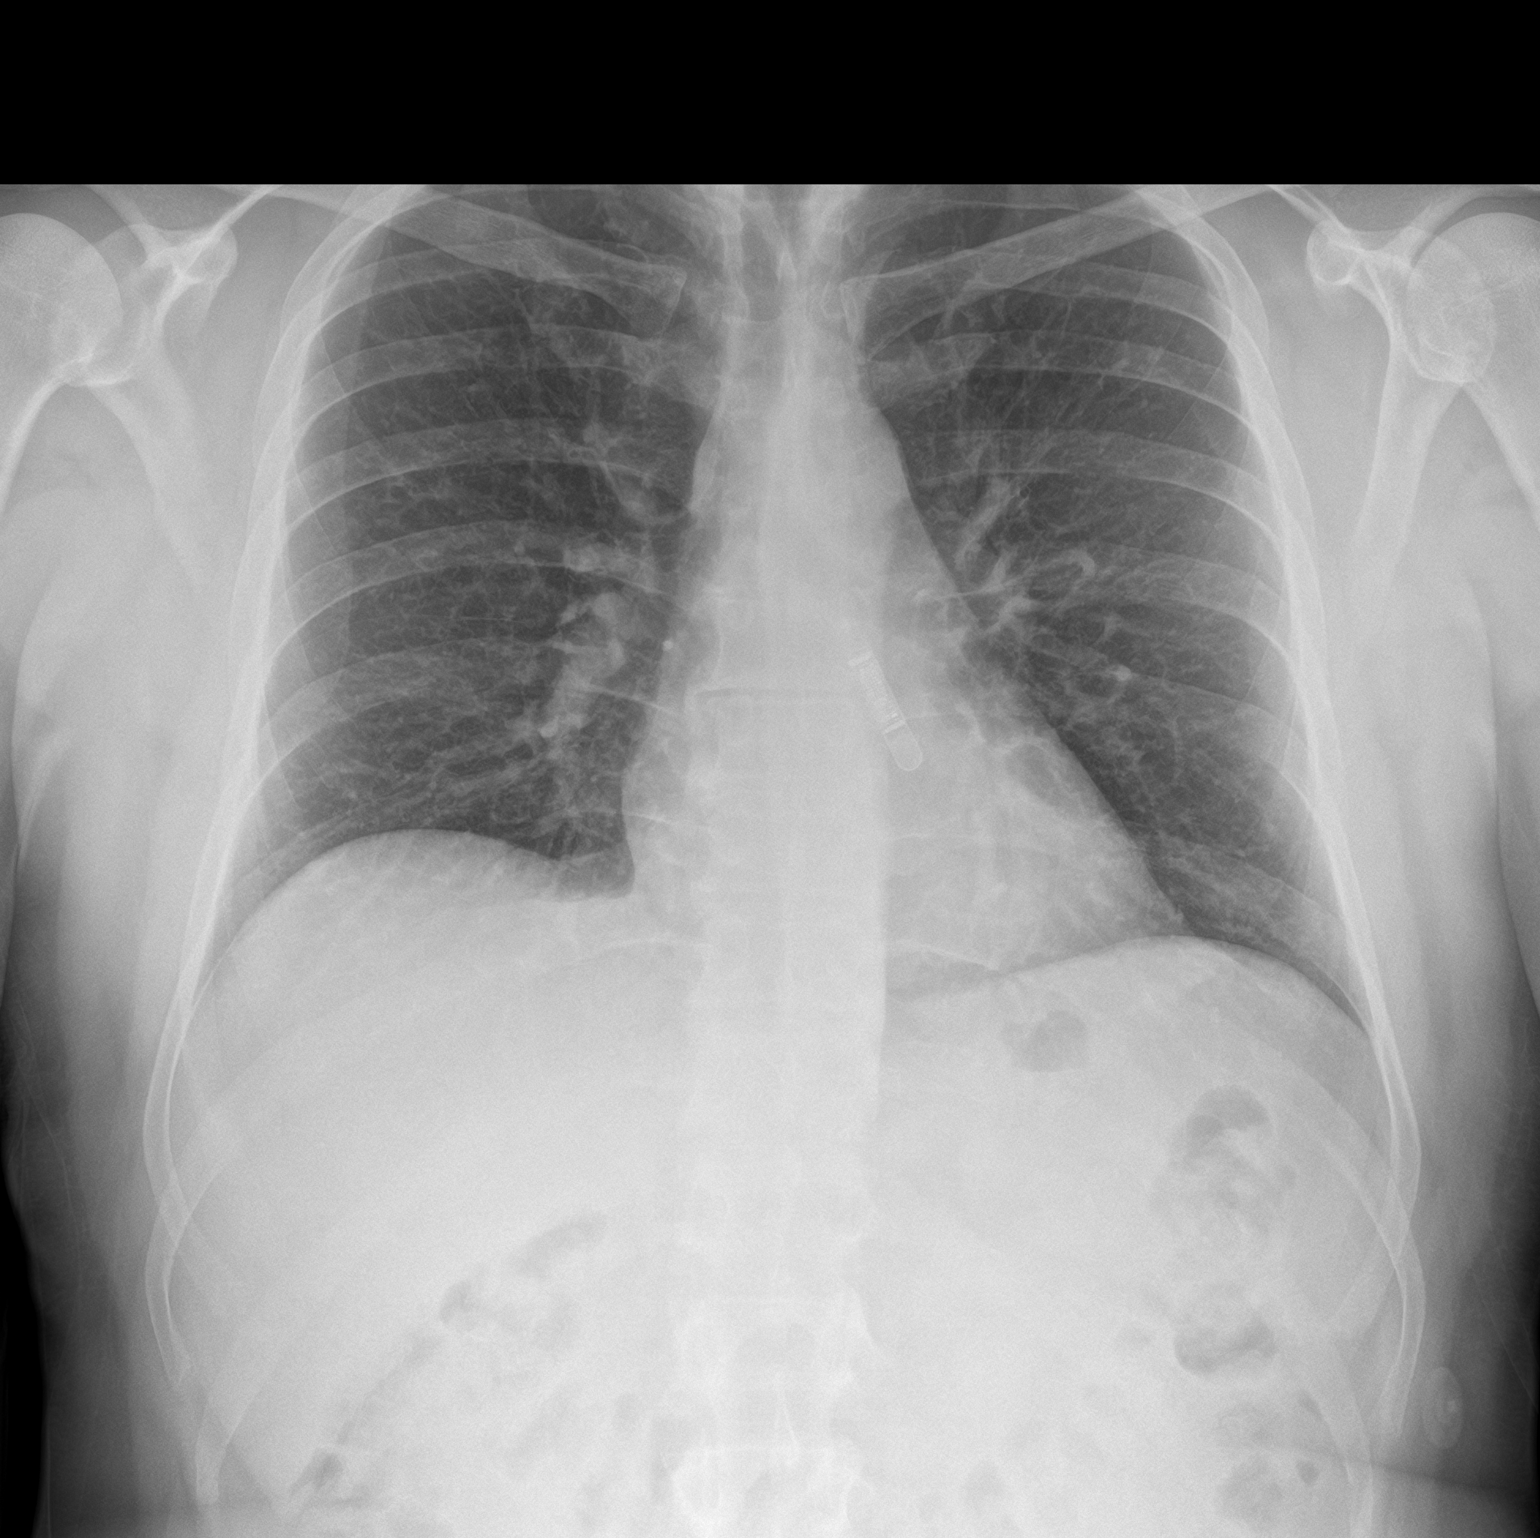

[chest lat]
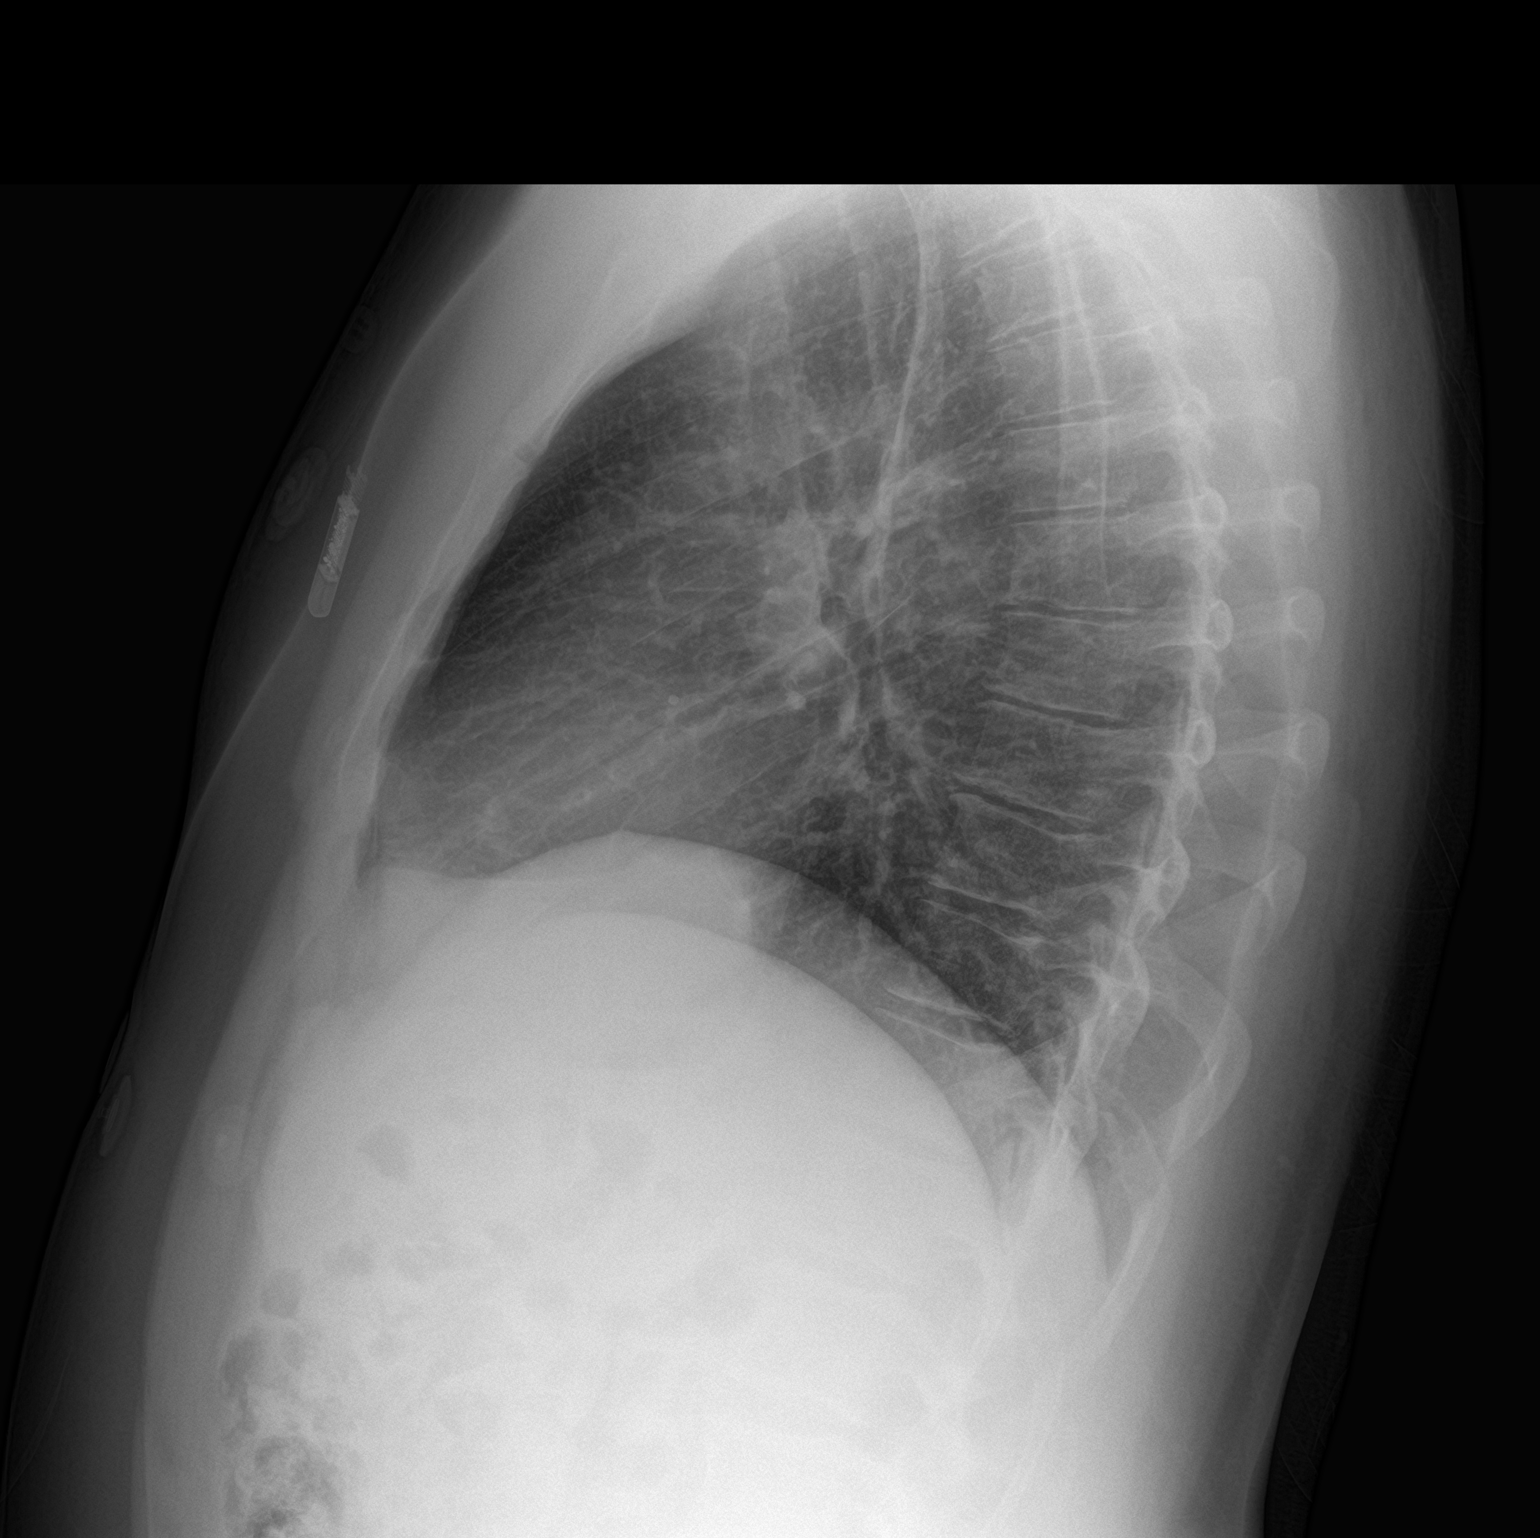

[2 of 2 positions shown; findings below may reference images not displayed]

FINDINGS: The heart size and mediastinal contours are within normal limits.
Both lungs are clear. No pleural effusion or pneumothorax. The
visualized skeletal structures are unremarkable.
IMPRESSION: No acute process in the chest.

## 2022-07-10 ENCOUNTER — Encounter: Payer: Self-pay | Admitting: Radiology

## 2022-08-04 ENCOUNTER — Encounter: Payer: Self-pay | Admitting: Family Medicine

## 2022-08-04 ENCOUNTER — Ambulatory Visit (INDEPENDENT_AMBULATORY_CARE_PROVIDER_SITE_OTHER): Payer: BC Managed Care – PPO | Admitting: Family Medicine

## 2022-08-04 VITALS — BP 123/86 | HR 67 | Ht 72.0 in | Wt 274.0 lb

## 2022-08-04 DIAGNOSIS — M5432 Sciatica, left side: Secondary | ICD-10-CM

## 2022-08-04 MED ORDER — PREDNISONE 20 MG PO TABS: ORAL_TABLET | ORAL | 0 refills | Status: DC

## 2022-08-04 NOTE — Progress Notes (Signed)
BP 123/86   Pulse 67   Ht 6' (1.829 m)   Wt 274 lb (124.3 kg)   SpO2 98%   BMI 37.16 kg/m    Subjective:   Patient ID: Ryan Conner, male    DOB: [birth year], 42 y.o.   MRN: [id number]  HPI: Ryan Conner is a 42 y.o. male presenting on 08/04/2022 for Back Pain (Left lower), Foot Pain (Bilateral- numb), and Weight Gain   HPI Patient is coming in today for back pain and left lower foot pain and numbness going down into his calf and his leg on the left side.  He has a little bit of the same on the right side but mostly on the left side.  He says is been bothering him over the past 2 months off-and-on, he says he has had some back issues before but they usually go away quick and this ones been more persistent.  He has not used any over-the-counter anti-inflammatories or muscle rubs or stretching to this point.  Relevant past medical, surgical, family and social history reviewed and updated as indicated. Interim medical history since our last visit reviewed. Allergies and medications reviewed and updated.  Review of Systems  Constitutional:  Negative for chills and fever.  Eyes:  Negative for visual disturbance.  Respiratory:  Negative for shortness of breath and wheezing.   Cardiovascular:  Negative for chest pain and leg swelling.  Musculoskeletal:  Negative for back pain and gait problem.  Skin:  Negative for rash.  Neurological:  Negative for dizziness, weakness and light-headedness.  All other systems reviewed and are negative.   Per HPI unless specifically indicated above   Allergies as of 08/04/2022   No Known Allergies      Medication List        Accurate as of August 04, 2022  9:23 AM. If you have any questions, ask your nurse or doctor.          STOP taking these medications    phentermine 30 MG capsule Stopped by: Fransisca Kaufmann Shakema Surita, MD       TAKE these medications    predniSONE 20 MG tablet Commonly known as: DELTASONE 2 po at same time daily for  5 days Started by: Fransisca Kaufmann Ximena Todaro, MD         Objective:   BP 123/86   Pulse 67   Ht 6' (1.829 m)   Wt 274 lb (124.3 kg)   SpO2 98%   BMI 37.16 kg/m   Wt Readings from Last 3 Encounters:  08/04/22 274 lb (124.3 kg)  09/11/21 261 lb (118.4 kg)  08/08/21 265 lb (120.2 kg)    Physical Exam Vitals and nursing note reviewed.  Constitutional:      General: He is not in acute distress.    Appearance: He is well-developed. He is not diaphoretic.  Eyes:     General: No scleral icterus.    Conjunctiva/sclera: Conjunctivae normal.  Neck:     Thyroid: No thyromegaly.  Musculoskeletal:        General: Normal range of motion.     Cervical back: Neck supple.  Lymphadenopathy:     Cervical: No cervical adenopathy.  Skin:    General: Skin is warm and dry.     Findings: No rash.  Neurological:     Mental Status: He is alert and oriented to person, place, and time.     Coordination: Coordination normal.  Psychiatric:        Behavior: Behavior  normal.       Assessment & Plan:   Problem List Items Addressed This Visit   None Visit Diagnoses     Left sciatic nerve pain    -  Primary   Relevant Medications   predniSONE (DELTASONE) 20 MG tablet       Give a short course of prednisone and will give a list of stretches and exercises to do.  Follow up plan: Return if symptoms worsen or fail to improve.  Counseling provided for all of the vaccine components No orders of the defined types were placed in this encounter.   Caryl Pina, MD [facility] 08/04/2022, 9:23 AM

## 2022-08-11 ENCOUNTER — Encounter: Payer: Self-pay | Admitting: Family Medicine

## 2022-08-11 DIAGNOSIS — M5432 Sciatica, left side: Secondary | ICD-10-CM

## 2022-08-18 ENCOUNTER — Telehealth (INDEPENDENT_AMBULATORY_CARE_PROVIDER_SITE_OTHER): Payer: BC Managed Care – PPO | Admitting: Family Medicine

## 2022-08-18 ENCOUNTER — Ambulatory Visit: Payer: BC Managed Care – PPO | Admitting: Physical Therapy

## 2022-08-18 ENCOUNTER — Encounter: Payer: Self-pay | Admitting: Family Medicine

## 2022-08-18 ENCOUNTER — Other Ambulatory Visit: Payer: Self-pay

## 2022-08-18 DIAGNOSIS — J3489 Other specified disorders of nose and nasal sinuses: Secondary | ICD-10-CM

## 2022-08-18 DIAGNOSIS — R509 Fever, unspecified: Secondary | ICD-10-CM

## 2022-08-18 DIAGNOSIS — R059 Cough, unspecified: Secondary | ICD-10-CM | POA: Diagnosis not present

## 2022-08-18 DIAGNOSIS — R6889 Other general symptoms and signs: Secondary | ICD-10-CM

## 2022-08-18 DIAGNOSIS — M791 Myalgia, unspecified site: Secondary | ICD-10-CM

## 2022-08-18 LAB — VERITOR FLU A/B WAIVED
Influenza A: NEGATIVE
Influenza B: NEGATIVE

## 2022-08-18 NOTE — Progress Notes (Signed)
   Virtual Visit via MyChart video note  I connected with Ryan Conner on 08/18/22 at 1229 by video and verified that I am speaking with the correct person using two identifiers. Ryan Conner is currently located at home and patient are currently with her during visit. The provider, Elige Radon Hye Trawick, MD is located in their office at time of visit.  Call ended at 1235  I discussed the limitations, risks, security and privacy concerns of performing an evaluation and management service by video and the availability of in person appointments. I also discussed with the patient that there may be a patient responsible charge related to this service. The patient expressed understanding and agreed to proceed.   History and Present Illness: Patient is calling in for fevers and chills and body aches and cough and congestion.  His son and wife were sick. They did not test for anything.  He has been sick for 2 days.  He has been getting worse and had a lot of sweats last night.  He denies SOB or wheezing.   1. Flu-like symptoms     Outpatient Encounter Medications as of 08/18/2022  Medication Sig   predniSONE (DELTASONE) 20 MG tablet 2 po at same time daily for 5 days   No facility-administered encounter medications on file as of 08/18/2022.    Review of Systems  Constitutional:  Positive for chills and fever.  HENT:  Positive for congestion, postnasal drip, rhinorrhea and sinus pressure. Negative for ear discharge, ear pain, sneezing, sore throat and voice change.   Eyes:  Negative for pain, discharge, redness and visual disturbance.  Respiratory:  Positive for cough. Negative for shortness of breath and wheezing.   Cardiovascular:  Negative for chest pain and leg swelling.  Musculoskeletal:  Positive for myalgias. Negative for gait problem.  Skin:  Negative for rash.  All other systems reviewed and are negative.   Observations/Objective: Patient sounds and looks comfortable.  Assessment and  Plan: Problem List Items Addressed This Visit   None Visit Diagnoses     Flu-like symptoms    -  Primary   Relevant Orders   Veritor Flu A/B Waived   Novel Coronavirus, NAA ([facility])       Patient will come in and get tested for flu and COVID and then we can determine which we need to treat like. Follow up plan: Return if symptoms worsen or fail to improve.     I discussed the assessment and treatment plan with the patient. The patient was provided an opportunity to ask questions and all were answered. The patient agreed with the plan and demonstrated an understanding of the instructions.   The patient was advised to call back or seek an in-person evaluation if the symptoms worsen or if the condition fails to improve as anticipated.  The above assessment and management plan was discussed with the patient. The patient verbalized understanding of and has agreed to the management plan. Patient is aware to call the clinic if symptoms persist or worsen. Patient is aware when to return to the clinic for a follow-up visit. Patient educated on when it is appropriate to go to the emergency department.    I provided 6 minutes of non-face-to-face time during this encounter.    Nils Pyle, MD

## 2022-08-19 ENCOUNTER — Encounter: Payer: Self-pay | Admitting: Family Medicine

## 2022-08-19 LAB — NOVEL CORONAVIRUS, NAA: SARS-CoV-2, NAA: NOT DETECTED

## 2022-08-20 MED ORDER — AMOXICILLIN 500 MG PO CAPS: 500.0000 mg | ORAL_CAPSULE | Freq: Two times a day (BID) | ORAL | 0 refills | Status: DC

## 2022-09-03 ENCOUNTER — Ambulatory Visit: Payer: BC Managed Care – PPO | Attending: Family Medicine

## 2022-09-03 ENCOUNTER — Other Ambulatory Visit: Payer: Self-pay

## 2022-09-03 DIAGNOSIS — M5416 Radiculopathy, lumbar region: Secondary | ICD-10-CM | POA: Diagnosis not present

## 2022-09-03 DIAGNOSIS — M5432 Sciatica, left side: Secondary | ICD-10-CM | POA: Diagnosis not present

## 2022-09-03 NOTE — Therapy (Signed)
OUTPATIENT PHYSICAL THERAPY THORACOLUMBAR EVALUATION   Patient Name: Ryan Conner MRN: [id number] DOB:05-31-1980, 42 y.o., male Today's Date: 09/03/2022  END OF SESSION:  PT End of Session - 09/03/22 0818     Visit Number 1    Number of Visits 8    Date for PT Re-Evaluation 10/10/22    PT Start Time 0819    PT Stop Time 0854    PT Time Calculation (min) 35 min    Activity Tolerance Patient tolerated treatment well    Behavior During Therapy [facility] for tasks assessed/performed             Past Medical History:  Diagnosis Date   Arrhythmia    Deep vein thrombosis (DVT)    Hyperlipidemia    Status post placement of implantable loop recorder 2014   Medtronic Reveal LINQ - implanted in [place]   Past Surgical History:  Procedure Laterality Date   CYSTOSCOPY N/A 02/21/2021   Procedure: CYSTOSCOPY;  Surgeon: Malen Gauze, MD;  Location: [facility];  Service: Urology;  Laterality: N/A;   CYSTOSCOPY W/ RETROGRADES Bilateral 10/15/2020   Procedure: CYSTOSCOPY WITH RETROGRADE PYELOGRAM;  Surgeon: Malen Gauze, MD;  Location: [facility];  Service: Urology;  Laterality: Bilateral;   Implantable loop recorder  2014   KNEE SURGERY Right    LOOP RECORDER REMOVAL     TRANSURETHRAL RESECTION OF BLADDER TUMOR N/A 10/15/2020   Procedure: TRANSURETHRAL RESECTION OF BLADDER TUMOR (TURBT);  Surgeon: Malen Gauze, MD;  Location: [facility];  Service: Urology;  Laterality: N/A;   TRANSURETHRAL RESECTION OF BLADDER TUMOR N/A 02/21/2021   Procedure: TRANSURETHRAL RESECTION OF BLADDER TUMOR (TURBT);  Surgeon: Malen Gauze, MD;  Location: [facility];  Service: Urology;  Laterality: N/A;   varicose veins removed   2012   bilateral legs    Patient Active Problem List   Diagnosis Date Noted   Malignant neoplasm of lateral wall of urinary bladder 10/22/2020   Status post placement of implantable loop recorder 09/25/2020   Occipital neuralgia of right side 07/04/2020   Chronic tension-type  headache, not intractable 06/11/2020   REFERRING PROVIDER: Dettinger, Elige Radon, MD   REFERRING DIAG: Left sciatic nerve pain   Rationale for Evaluation and Treatment: Rehabilitation  THERAPY DIAG:  Radiculopathy, lumbar region  ONSET DATE: 2023  SUBJECTIVE:                                                                                                                                                                                           SUBJECTIVE STATEMENT: Patient reports that he has been having left low back and radiating pain  down his left leg. He has noticed that it has been getting worse since it first began. However, he has also been having numbness in both legs below his knees.  He has experienced back pain before, but nothing like this. He is unsure what caused this because he did not tweak his back or anything from what he can recall. His pain will get so bad that he can hardly move due to the pain.   PERTINENT HISTORY:  History of a bladder tumor  PAIN:  Are you having pain? Yes: NPRS scale: 8/10 Pain location: left low back and down left leg (pain) with numbness in BLE Pain description: numbness, shooting, aching  Aggravating factors: standing, walking, kneeling,  Relieving factors: sitting for a while   PRECAUTIONS: None  WEIGHT BEARING RESTRICTIONS: No  FALLS:  Has patient fallen in last 6 months? No  LIVING ENVIRONMENT: Lives with: lives with their family Lives in: House/apartment Has following equipment at home: None  OCCUPATION: cable guy: 8 hour shift, primarily standing, walking, with occasional carrying a ladder  PLOF: Independent  PATIENT GOALS: reduced pain and numbness, be able to stand and walk longer, and be able to do his normal activities without pain  NEXT MD VISIT: 11/06/22  OBJECTIVE:   PATIENT SURVEYS:  FOTO 56.31  SCREENING FOR RED FLAGS: Bowel or bladder incontinence: No Spinal tumors: No Cauda equina syndrome: No Compression  fracture: No Abdominal aneurysm: No  COGNITION: Overall cognitive status: Within functional limits for tasks assessed     SENSATION: Light touch: Impaired  and diminished sensation in right foot and ankle Patient reports numbness in both feet currently.   PALPATION: TTP: left piriformis, right lumbar paraspinals, and QL  LUMBAR JOINT MOBILITY:  WFL throughout with L1-2 reproducing his familiar shooting pain  LUMBAR ROM:   AROM eval  Flexion 72; slight pain   Extension 10; "tight"   Right lateral flexion 8  Left lateral flexion 14  Right rotation WFL  Left rotation 25% limited   (Blank rows = not tested)  LOWER EXTREMITY ROM: WFL for activities assessed  LOWER EXTREMITY MMT:    MMT Right eval Left eval  Hip flexion 4+/5 4+/5  Hip extension    Hip abduction    Hip adduction    Hip internal rotation    Hip external rotation    Knee flexion 4+/5 4+/5  Knee extension 5/5 4+/5  Ankle dorsiflexion 4-/5 4-/5  Ankle plantarflexion    Ankle inversion    Ankle eversion     (Blank rows = not tested)  LUMBAR SPECIAL TESTS:  Straight leg raise test: Negative, Slump test: Positive, and FABER test: Positive  GAIT: Assistive device utilized: None Level of assistance: Complete Independence Comments: no significant gait deviations  TODAY'S TREATMENT:  DATE:                                     4/24 EXERCISE LOG  Exercise Repetitions and Resistance Comments  STM with tennis ball    Seated nerve glides                 Blank cell = exercise not performed today   PATIENT EDUCATION:  Education details: HEP, plan of care, healing, prognosis, and goals for therapy Person educated: Patient Education method: Explanation Education comprehension: verbalized understanding  HOME EXERCISE PROGRAM: Today's interventions were added to his HEP and he was  able to properly demonstrate these exercises.  He was encouraged to complete these interventions twice per day and reported understanding.  ASSESSMENT:  CLINICAL IMPRESSION: Patient is a 42 y.o. male who was seen today for physical therapy evaluation and treatment for lumbar radiculopathy.  He presented with high pain severity and moderate irritability with lumbar joint mobility testing and palpation being the most aggravating to his familiar symptoms.  His familiar symptoms were also able to be reproduced with special testing which included slump and FABER testing.  He was provided a HEP which she was able to properly perform.  Recommend that he continue with skilled physical therapy to address his impairments to return to his prior level of function.  OBJECTIVE IMPAIRMENTS: decreased activity tolerance, decreased mobility, difficulty walking, decreased ROM, decreased strength, impaired tone, and pain.   ACTIVITY LIMITATIONS: carrying, lifting, standing, squatting, and locomotion level  PARTICIPATION LIMITATIONS: meal prep, cleaning, laundry, shopping, community activity, occupation, and yard work  PERSONAL FACTORS: Profession, Time since onset of injury/illness/exacerbation, and 1 comorbidity: history of a bladder tumor  are also affecting patient's functional outcome.   REHAB POTENTIAL: Fair    CLINICAL DECISION MAKING: Evolving/moderate complexity  EVALUATION COMPLEXITY: Moderate   GOALS: Goals reviewed with patient? Yes  LONG TERM GOALS: Target date: 10/01/22  Patient will be independent with his HEP.  Baseline:  Goal status: INITIAL  2.  Patient will be able to complete his daily activities without his familiar symptoms exceeding 6/10. Baseline:  Goal status: INITIAL  3.  Patient will be able to complete his critical job demands without being limited by his low back and lower extremity symptoms. Baseline:  Goal status: INITIAL  4.  Patient will be able to demonstrate at  least 20 degrees of lumbar sidebending bilaterally for improved lumbar mobility. Baseline:  Goal status: INITIAL  PLAN:  PT FREQUENCY: 2x/week  PT DURATION: 4 weeks  PLANNED INTERVENTIONS: Therapeutic exercises, Therapeutic activity, Neuromuscular re-education, Balance training, Patient/Family education, Self Care, Joint mobilization, Dry Needling, Electrical stimulation, Spinal mobilization, Cryotherapy, Moist heat, Traction, Ultrasound, Manual therapy, and Re-evaluation.  PLAN FOR NEXT SESSION: NuStep, lumbar stabilization and strengthening, mechanical traction, manual therapy, and modalities as needed   Granville Lewis, PT 09/03/2022, 3:57 PM

## 2022-09-08 ENCOUNTER — Ambulatory Visit: Payer: BC Managed Care – PPO

## 2022-09-08 DIAGNOSIS — M5432 Sciatica, left side: Secondary | ICD-10-CM | POA: Diagnosis not present

## 2022-09-08 DIAGNOSIS — M5416 Radiculopathy, lumbar region: Secondary | ICD-10-CM | POA: Diagnosis not present

## 2022-09-08 NOTE — Therapy (Signed)
OUTPATIENT PHYSICAL THERAPY THORACOLUMBAR TREATMENT   Patient Name: Ryan Conner MRN: [id number] DOB:[birth year], 43 y.o., male Today's Date: 09/08/2022  END OF SESSION:  PT End of Session - 09/08/22 0819     Visit Number 2    Number of Visits 8    Date for PT Re-Evaluation 10/10/22    PT Start Time 0815    PT Stop Time 0858    PT Time Calculation (min) 43 min    Activity Tolerance Patient tolerated treatment well    Behavior During Therapy [facility] for tasks assessed/performed              Past Medical History:  Diagnosis Date   Arrhythmia    Deep vein thrombosis (DVT) (HCC)    Hyperlipidemia    Status post placement of implantable loop recorder 2014   Medtronic Reveal LINQ - implanted in [place]   Past Surgical History:  Procedure Laterality Date   CYSTOSCOPY N/A 02/21/2021   Procedure: CYSTOSCOPY;  Surgeon: Malen Gauze, MD;  Location: [facility];  Service: Urology;  Laterality: N/A;   CYSTOSCOPY W/ RETROGRADES Bilateral 10/15/2020   Procedure: CYSTOSCOPY WITH RETROGRADE PYELOGRAM;  Surgeon: Malen Gauze, MD;  Location: [facility];  Service: Urology;  Laterality: Bilateral;   Implantable loop recorder  2014   KNEE SURGERY Right    LOOP RECORDER REMOVAL     TRANSURETHRAL RESECTION OF BLADDER TUMOR N/A 10/15/2020   Procedure: TRANSURETHRAL RESECTION OF BLADDER TUMOR (TURBT);  Surgeon: Malen Gauze, MD;  Location: [facility];  Service: Urology;  Laterality: N/A;   TRANSURETHRAL RESECTION OF BLADDER TUMOR N/A 02/21/2021   Procedure: TRANSURETHRAL RESECTION OF BLADDER TUMOR (TURBT);  Surgeon: Malen Gauze, MD;  Location: [facility];  Service: Urology;  Laterality: N/A;   varicose veins removed   2012   bilateral legs    Patient Active Problem List   Diagnosis Date Noted   Malignant neoplasm of lateral wall of urinary bladder (HCC) 10/22/2020   Status post placement of implantable loop recorder 09/25/2020   Occipital neuralgia of right side 07/04/2020   Chronic  tension-type headache, not intractable 06/11/2020   REFERRING PROVIDER: Dettinger, Elige Radon, MD   REFERRING DIAG: Left sciatic nerve pain   Rationale for Evaluation and Treatment: Rehabilitation  THERAPY DIAG:  Radiculopathy, lumbar region  ONSET DATE: 2023  SUBJECTIVE:                                                                                                                                                                                           SUBJECTIVE STATEMENT: Patient reports that his HEP helps some, but it does  not last.   PERTINENT HISTORY:  History of a bladder tumor  PAIN:  Are you having pain? Yes: NPRS scale: 5/10 Pain location: left low back and down left leg (pain) with numbness in BLE Pain description: numbness, shooting, aching  Aggravating factors: standing, walking, kneeling,  Relieving factors: sitting for a while   PRECAUTIONS: None  WEIGHT BEARING RESTRICTIONS: No  FALLS:  Has patient fallen in last 6 months? No  LIVING ENVIRONMENT: Lives with: lives with their family Lives in: House/apartment Has following equipment at home: None  OCCUPATION: cable guy: 8 hour shift, primarily standing, walking, with occasional carrying a ladder  PLOF: Independent  PATIENT GOALS: reduced pain and numbness, be able to stand and walk longer, and be able to do his normal activities without pain  NEXT MD VISIT: 11/06/22  OBJECTIVE: all objective measures were assessed at his initial evaluation on 09/03/22 unless otherwise noted  PATIENT SURVEYS:  FOTO 56.31  SCREENING FOR RED FLAGS: Bowel or bladder incontinence: No Spinal tumors: No Cauda equina syndrome: No Compression fracture: No Abdominal aneurysm: No  COGNITION: Overall cognitive status: Within functional limits for tasks assessed     SENSATION: Light touch: Impaired  and diminished sensation in right foot and ankle Patient reports numbness in both feet currently.   PALPATION: TTP: left  piriformis, right lumbar paraspinals, and QL  LUMBAR JOINT MOBILITY:  WFL throughout with L1-2 reproducing his familiar shooting pain  LUMBAR ROM:   AROM eval  Flexion 72; slight pain   Extension 10; "tight"   Right lateral flexion 8  Left lateral flexion 14  Right rotation WFL  Left rotation 25% limited   (Blank rows = not tested)  LOWER EXTREMITY ROM: WFL for activities assessed  LOWER EXTREMITY MMT:    MMT Right eval Left eval  Hip flexion 4+/5 4+/5  Hip extension    Hip abduction    Hip adduction    Hip internal rotation    Hip external rotation    Knee flexion 4+/5 4+/5  Knee extension 5/5 4+/5  Ankle dorsiflexion 4-/5 4-/5  Ankle plantarflexion    Ankle inversion    Ankle eversion     (Blank rows = not tested)  LUMBAR SPECIAL TESTS:  Straight leg raise test: Negative, Slump test: Positive, and FABER test: Positive  GAIT: Assistive device utilized: None Level of assistance: Complete Independence Comments: no significant gait deviations  TODAY'S TREATMENT:                                                                                                                              DATE:                                     4/29 EXERCISE LOG  Exercise Repetitions and Resistance Comments  Nustep  L5 x 11 minutes   Resisted pull down  Blue  XTS x 2 minutes   Wood chops  Blue XTS x 1.5 minutes each    Slouch overcorrect 3 minutes        Blank cell = exercise not performed today  Manual Therapy Soft Tissue Mobilization: lumbar paraspinals, for reduced pain and tone Joint Mobilizations: T12-L2, grade I-III                                     4/24 EXERCISE LOG  Exercise Repetitions and Resistance Comments  STM with tennis ball    Seated nerve glides                 Blank cell = exercise not performed today   PATIENT EDUCATION:  Education details: HEP, plan of care, healing, prognosis, and goals for therapy Person educated: Patient Education method:  Explanation Education comprehension: verbalized understanding  HOME EXERCISE PROGRAM: Today's interventions were added to his HEP and he was able to properly demonstrate these exercises.  He was encouraged to complete these interventions twice per day and reported understanding.  ASSESSMENT:  CLINICAL IMPRESSION: Patient was introduced to multiple new interventions for improved lumbar stabilization for improved posture. He required minimal cueing with today's new interventions for proper exercise performance. Manual therapy focused on lumbar joint mobilizations with T12-L1 joint mobilizations being the most effective. He reported feeling alright upon the conclusion of treatment. He continues to require skilled physical therapy to address his remaining impairments to maximize his functional mobility.   OBJECTIVE IMPAIRMENTS: decreased activity tolerance, decreased mobility, difficulty walking, decreased ROM, decreased strength, impaired tone, and pain.   ACTIVITY LIMITATIONS: carrying, lifting, standing, squatting, and locomotion level  PARTICIPATION LIMITATIONS: meal prep, cleaning, laundry, shopping, community activity, occupation, and yard work  PERSONAL FACTORS: Profession, Time since onset of injury/illness/exacerbation, and 1 comorbidity: history of a bladder tumor  are also affecting patient's functional outcome.   REHAB POTENTIAL: Fair    CLINICAL DECISION MAKING: Evolving/moderate complexity  EVALUATION COMPLEXITY: Moderate   GOALS: Goals reviewed with patient? Yes  LONG TERM GOALS: Target date: 10/01/22  Patient will be independent with his HEP.  Baseline:  Goal status: INITIAL  2.  Patient will be able to complete his daily activities without his familiar symptoms exceeding 6/10. Baseline:  Goal status: INITIAL  3.  Patient will be able to complete his critical job demands without being limited by his low back and lower extremity symptoms. Baseline:  Goal status:  INITIAL  4.  Patient will be able to demonstrate at least 20 degrees of lumbar sidebending bilaterally for improved lumbar mobility. Baseline:  Goal status: INITIAL  PLAN:  PT FREQUENCY: 2x/week  PT DURATION: 4 weeks  PLANNED INTERVENTIONS: Therapeutic exercises, Therapeutic activity, Neuromuscular re-education, Balance training, Patient/Family education, Self Care, Joint mobilization, Dry Needling, Electrical stimulation, Spinal mobilization, Cryotherapy, Moist heat, Traction, Ultrasound, Manual therapy, and Re-evaluation.  PLAN FOR NEXT SESSION: NuStep, lumbar stabilization and strengthening, mechanical traction, manual therapy, and modalities as needed   Granville Lewis, PT 09/08/2022, 9:11 AM

## 2022-09-10 ENCOUNTER — Ambulatory Visit: Payer: BC Managed Care – PPO | Attending: Family Medicine

## 2022-09-10 DIAGNOSIS — M5416 Radiculopathy, lumbar region: Secondary | ICD-10-CM | POA: Diagnosis not present

## 2022-09-10 NOTE — Therapy (Signed)
OUTPATIENT PHYSICAL THERAPY THORACOLUMBAR TREATMENT   Patient Name: Ryan Conner MRN: [id number] DOB:June 07, 1980, 42 y.o., male Today's Date: 09/10/2022  END OF SESSION:  PT End of Session - 09/10/22 0823     Visit Number 3    Number of Visits 8    Date for PT Re-Evaluation 10/10/22    PT Start Time 0815    PT Stop Time 0912    PT Time Calculation (min) 57 min    Activity Tolerance Patient tolerated treatment well    Behavior During Therapy [facility] for tasks assessed/performed              Past Medical History:  Diagnosis Date   Arrhythmia    Deep vein thrombosis (DVT) (HCC)    Hyperlipidemia    Status post placement of implantable loop recorder 2014   [employer]tronic Reveal LINQ - implanted in [place]   Past Surgical History:  Procedure Laterality Date   CYSTOSCOPY N/A 02/21/2021   Procedure: CYSTOSCOPY;  Surgeon: Malen Gauze, MD;  Location: [facility];  Service: Urology;  Laterality: N/A;   CYSTOSCOPY W/ RETROGRADES Bilateral 10/15/2020   Procedure: CYSTOSCOPY WITH RETROGRADE PYELOGRAM;  Surgeon: Malen Gauze, MD;  Location: [facility];  Service: Urology;  Laterality: Bilateral;   Implantable loop recorder  2014   KNEE SURGERY Right    LOOP RECORDER REMOVAL     TRANSURETHRAL RESECTION OF BLADDER TUMOR N/A 10/15/2020   Procedure: TRANSURETHRAL RESECTION OF BLADDER TUMOR (TURBT);  Surgeon: Malen Gauze, MD;  Location: [facility];  Service: Urology;  Laterality: N/A;   TRANSURETHRAL RESECTION OF BLADDER TUMOR N/A 02/21/2021   Procedure: TRANSURETHRAL RESECTION OF BLADDER TUMOR (TURBT);  Surgeon: Malen Gauze, MD;  Location: [facility];  Service: Urology;  Laterality: N/A;   varicose veins removed   2012   bilateral legs    Patient Active Problem List   Diagnosis Date Noted   Malignant neoplasm of lateral wall of urinary bladder (HCC) 10/22/2020   Status post placement of implantable loop recorder 09/25/2020   Occipital neuralgia of right side 07/04/2020   Chronic  tension-type headache, not intractable 06/11/2020   REFERRING PROVIDER: Dettinger, Elige Radon, MD   REFERRING DIAG: Left sciatic nerve pain   Rationale for Evaluation and Treatment: Rehabilitation  THERAPY DIAG:  Radiculopathy, lumbar region  ONSET DATE: 2023  SUBJECTIVE:                                                                                                                                                                                           SUBJECTIVE STATEMENT: Patient reports that his back felt alright for a few  hours after his last appointment. However, yesterday was a bad day as he was having pain in his back shooting down to his leg at a 8/10. He feels a little better today, but his back still hurts.   PERTINENT HISTORY:  History of a bladder tumor  PAIN:  Are you having pain? Yes: NPRS scale: 5/10 Pain location: left low back and down left leg (pain) with numbness in BLE Pain description: numbness, shooting, aching  Aggravating factors: standing, walking, kneeling,  Relieving factors: sitting for a while   PRECAUTIONS: None  WEIGHT BEARING RESTRICTIONS: No  FALLS:  Has patient fallen in last 6 months? No  LIVING ENVIRONMENT: Lives with: lives with their family Lives in: House/apartment Has following equipment at home: None  OCCUPATION: cable guy: 8 hour shift, primarily standing, walking, with occasional carrying a ladder  PLOF: Independent  PATIENT GOALS: reduced pain and numbness, be able to stand and walk longer, and be able to do his normal activities without pain  NEXT MD VISIT: 11/06/22  OBJECTIVE: all objective measures were assessed at his initial evaluation on 09/03/22 unless otherwise noted  PATIENT SURVEYS:  FOTO 56.31  SCREENING FOR RED FLAGS: Bowel or bladder incontinence: No Spinal tumors: No Cauda equina syndrome: No Compression fracture: No Abdominal aneurysm: No  COGNITION: Overall cognitive status: Within functional limits  for tasks assessed     SENSATION: Light touch: Impaired  and diminished sensation in right foot and ankle Patient reports numbness in both feet currently.   PALPATION: TTP: left piriformis, right lumbar paraspinals, and QL  LUMBAR JOINT MOBILITY:  WFL throughout with L1-2 reproducing his familiar shooting pain  LUMBAR ROM:   AROM eval  Flexion 72; slight pain   Extension 10; "tight"   Right lateral flexion 8  Left lateral flexion 14  Right rotation WFL  Left rotation 25% limited   (Blank rows = not tested)  LOWER EXTREMITY ROM: WFL for activities assessed  LOWER EXTREMITY MMT:    MMT Right eval Left eval  Hip flexion 4+/5 4+/5  Hip extension    Hip abduction    Hip adduction    Hip internal rotation    Hip external rotation    Knee flexion 4+/5 4+/5  Knee extension 5/5 4+/5  Ankle dorsiflexion 4-/5 4-/5  Ankle plantarflexion    Ankle inversion    Ankle eversion     (Blank rows = not tested)  LUMBAR SPECIAL TESTS:  Straight leg raise test: Negative, Slump test: Positive, and FABER test: Positive  GAIT: Assistive device utilized: None Level of assistance: Complete Independence Comments: no significant gait deviations  TODAY'S TREATMENT:                                                                                                                              DATE:  5/1 EXERCISE LOG  Exercise Repetitions and Resistance Comments  Nustep  L7 x 15 minutes   Rocker board  2 minutes   Bridge  25 reps    SLR  30 reps each    Thomas stretch  2 minutes  LLE only   Lower trunk rotation 30 reps each   Figure 4 stretch  3 x 30 seconds each     Blank cell = exercise not performed today  Manual Therapy Manual Traction: LLE long axis distraction, for reduced pain   Modalities: no redness or adverse reaction to today's modalities  Date:  Unattended Estim: Lumbar, pre mod @ 80-150 Hz, 15 mins, Pain Hot Pack: Lumbar, 15 mins,  Pain                                   4/29 EXERCISE LOG  Exercise Repetitions and Resistance Comments  Nustep  L5 x 11 minutes   Resisted pull down  Blue XTS x 2 minutes   Wood chops  Blue XTS x 1.5 minutes each    Slouch overcorrect 3 minutes        Blank cell = exercise not performed today  Manual Therapy Soft Tissue Mobilization: lumbar paraspinals, for reduced pain and tone Joint Mobilizations: T12-L2, grade I-III                                     4/24 EXERCISE LOG  Exercise Repetitions and Resistance Comments  STM with tennis ball    Seated nerve glides                 Blank cell = exercise not performed today   PATIENT EDUCATION:  Education details: HEP, plan of care, healing, prognosis, and goals for therapy Person educated: Patient Education method: Explanation Education comprehension: verbalized understanding  HOME EXERCISE PROGRAM: Today's interventions were added to his HEP and he was able to properly demonstrate these exercises.  He was encouraged to complete these interventions twice per day and reported understanding.  ASSESSMENT:  CLINICAL IMPRESSION: Patient was introduced to multiple new interventions for reduced pain and improved lumbar mobility. He required minimal cueing with today's new interventions for proper biomechanics to avoid compensatory patterns. Left lower extremity long axis distraction was utilized for reduced pain with good results. He experienced a brief increase in left lower back pain, but this resolved quickly. He experienced no significant increase in pain or discomfort with any of today's interventions. He reported feeling good upon the conclusion of treatment. He continues to require skilled physical therapy to address his remaining impairments to return to his prior level of function.   OBJECTIVE IMPAIRMENTS: decreased activity tolerance, decreased mobility, difficulty walking, decreased ROM, decreased strength, impaired tone, and  pain.   ACTIVITY LIMITATIONS: carrying, lifting, standing, squatting, and locomotion level  PARTICIPATION LIMITATIONS: meal prep, cleaning, laundry, shopping, community activity, occupation, and yard work  PERSONAL FACTORS: Profession, Time since onset of injury/illness/exacerbation, and 1 comorbidity: history of a bladder tumor  are also affecting patient's functional outcome.   REHAB POTENTIAL: Fair    CLINICAL DECISION MAKING: Evolving/moderate complexity  EVALUATION COMPLEXITY: Moderate   GOALS: Goals reviewed with patient? Yes  LONG TERM GOALS: Target date: 10/01/22  Patient will be independent with his HEP.  Baseline:  Goal status: INITIAL  2.  Patient will be able  to complete his daily activities without his familiar symptoms exceeding 6/10. Baseline:  Goal status: INITIAL  3.  Patient will be able to complete his critical job demands without being limited by his low back and lower extremity symptoms. Baseline:  Goal status: INITIAL  4.  Patient will be able to demonstrate at least 20 degrees of lumbar sidebending bilaterally for improved lumbar mobility. Baseline:  Goal status: INITIAL  PLAN:  PT FREQUENCY: 2x/week  PT DURATION: 4 weeks  PLANNED INTERVENTIONS: Therapeutic exercises, Therapeutic activity, Neuromuscular re-education, Balance training, Patient/Family education, Self Care, Joint mobilization, Dry Needling, Electrical stimulation, Spinal mobilization, Cryotherapy, Moist heat, Traction, Ultrasound, Manual therapy, and Re-evaluation.  PLAN FOR NEXT SESSION: NuStep, lumbar stabilization and strengthening, mechanical traction, manual therapy, and modalities as needed   Granville Lewis, PT 09/10/2022, 3:36 PM

## 2022-09-15 ENCOUNTER — Telehealth: Payer: Self-pay | Admitting: Family Medicine

## 2022-09-15 ENCOUNTER — Ambulatory Visit: Payer: BC Managed Care – PPO

## 2022-09-15 DIAGNOSIS — M5416 Radiculopathy, lumbar region: Secondary | ICD-10-CM

## 2022-09-15 DIAGNOSIS — M5442 Lumbago with sciatica, left side: Secondary | ICD-10-CM

## 2022-09-15 NOTE — Therapy (Signed)
OUTPATIENT PHYSICAL THERAPY THORACOLUMBAR TREATMENT   Patient Name: Ryan Conner MRN: [id number] DOB:1981-04-06, 42 y.o., male Today's Date: 09/15/2022  END OF SESSION:  PT End of Session - 09/15/22 0818     Visit Number 4    Number of Visits 8    Date for PT Re-Evaluation 10/10/22    PT Start Time 0815    PT Stop Time 0906    PT Time Calculation (min) 51 min    Activity Tolerance Patient tolerated treatment well    Behavior During Therapy [facility] for tasks assessed/performed              Past Medical History:  Diagnosis Date   Arrhythmia    Deep vein thrombosis (DVT) (HCC)    Hyperlipidemia    Status post placement of implantable loop recorder 2014   Medtronic Reveal LINQ - implanted in [place]   Past Surgical History:  Procedure Laterality Date   CYSTOSCOPY N/A 02/21/2021   Procedure: CYSTOSCOPY;  Surgeon: Malen Gauze, MD;  Location: [facility];  Service: Urology;  Laterality: N/A;   CYSTOSCOPY W/ RETROGRADES Bilateral 10/15/2020   Procedure: CYSTOSCOPY WITH RETROGRADE PYELOGRAM;  Surgeon: Malen Gauze, MD;  Location: [facility];  Service: Urology;  Laterality: Bilateral;   Implantable loop recorder  2014   KNEE SURGERY Right    LOOP RECORDER REMOVAL     TRANSURETHRAL RESECTION OF BLADDER TUMOR N/A 10/15/2020   Procedure: TRANSURETHRAL RESECTION OF BLADDER TUMOR (TURBT);  Surgeon: Malen Gauze, MD;  Location: [facility];  Service: Urology;  Laterality: N/A;   TRANSURETHRAL RESECTION OF BLADDER TUMOR N/A 02/21/2021   Procedure: TRANSURETHRAL RESECTION OF BLADDER TUMOR (TURBT);  Surgeon: Malen Gauze, MD;  Location: [facility];  Service: Urology;  Laterality: N/A;   varicose veins removed   2012   bilateral legs    Patient Active Problem List   Diagnosis Date Noted   Malignant neoplasm of lateral wall of urinary bladder (HCC) 10/22/2020   Status post placement of implantable loop recorder 09/25/2020   Occipital neuralgia of right side 07/04/2020   Chronic  tension-type headache, not intractable 06/11/2020   REFERRING PROVIDER: Dettinger, Elige Radon, MD   REFERRING DIAG: Left sciatic nerve pain   Rationale for Evaluation and Treatment: Rehabilitation  THERAPY DIAG:  Radiculopathy, lumbar region  ONSET DATE: 2023  SUBJECTIVE:                                                                                                                                                                                           SUBJECTIVE STATEMENT: Pt reports 5/10 low back pain today.  Pt reports  that he feels a couple hours of relief after therapy, but pain comes back.   PERTINENT HISTORY:  History of a bladder tumor  PAIN:  Are you having pain? Yes: NPRS scale: 5/10 Pain location: left low back and down left leg (pain) with numbness in BLE Pain description: numbness, shooting, aching  Aggravating factors: standing, walking, kneeling,  Relieving factors: sitting for a while   PRECAUTIONS: None  WEIGHT BEARING RESTRICTIONS: No  FALLS:  Has patient fallen in last 6 months? No  LIVING ENVIRONMENT: Lives with: lives with their family Lives in: House/apartment Has following equipment at home: None  OCCUPATION: cable guy: 8 hour shift, primarily standing, walking, with occasional carrying a ladder  PLOF: Independent  PATIENT GOALS: reduced pain and numbness, be able to stand and walk longer, and be able to do his normal activities without pain  NEXT MD VISIT: 11/06/22  OBJECTIVE: all objective measures were assessed at his initial evaluation on 09/03/22 unless otherwise noted  PATIENT SURVEYS:  FOTO 56.31  SCREENING FOR RED FLAGS: Bowel or bladder incontinence: No Spinal tumors: No Cauda equina syndrome: No Compression fracture: No Abdominal aneurysm: No  COGNITION: Overall cognitive status: Within functional limits for tasks assessed     SENSATION: Light touch: Impaired  and diminished sensation in right foot and ankle Patient  reports numbness in both feet currently.   PALPATION: TTP: left piriformis, right lumbar paraspinals, and QL  LUMBAR JOINT MOBILITY:  WFL throughout with L1-2 reproducing his familiar shooting pain  LUMBAR ROM:   AROM eval  Flexion 72; slight pain   Extension 10; "tight"   Right lateral flexion 8  Left lateral flexion 14  Right rotation WFL  Left rotation 25% limited   (Blank rows = not tested)  LOWER EXTREMITY ROM: WFL for activities assessed  LOWER EXTREMITY MMT:    MMT Right eval Left eval  Hip flexion 4+/5 4+/5  Hip extension    Hip abduction    Hip adduction    Hip internal rotation    Hip external rotation    Knee flexion 4+/5 4+/5  Knee extension 5/5 4+/5  Ankle dorsiflexion 4-/5 4-/5  Ankle plantarflexion    Ankle inversion    Ankle eversion     (Blank rows = not tested)  LUMBAR SPECIAL TESTS:  Straight leg raise test: Negative, Slump test: Positive, and FABER test: Positive  GAIT: Assistive device utilized: None Level of assistance: Complete Independence Comments: no significant gait deviations  TODAY'S TREATMENT:                                                                                                                              DATE:       5/6 EXERCISE LOG  Exercise Repetitions and Resistance Comments  Nustep  L7 x 15 minutes   Rocker board  3 minutes   Standing Hip Abduction x20 reps bil   Standing Marches x20 reps bil   [employer]  Press x3 mins with 3 sec hold   Ball Rollout x3 mins with 3 sec hold   Bridge  25 reps    SLR  30 reps each    Thomas stretch   LLE only   Lower trunk rotation    Figure 4 stretch      Blank cell = exercise not performed today   Modalities: no redness or adverse reaction to today's modalities  Date:  Unattended Estim: Lumbar, pre mod @ 80-150 Hz, 15 mins, Pain Hot Pack: Lumbar, 15 mins, Pain                                      5/1 EXERCISE LOG  Exercise Repetitions and Resistance Comments  Nustep  L7  x 15 minutes   Rocker board  2 minutes   Bridge  25 reps    SLR  30 reps each    Thomas stretch  2 minutes  LLE only   Lower trunk rotation 30 reps each   Figure 4 stretch  3 x 30 seconds each     Blank cell = exercise not performed today  Manual Therapy Manual Traction: LLE long axis distraction, for reduced pain   Modalities: no redness or adverse reaction to today's modalities  Date:  Unattended Estim: Lumbar, pre mod @ 80-150 Hz, 15 mins, Pain Hot Pack: Lumbar, 15 mins, Pain                               PATIENT EDUCATION:  Education details: HEP, plan of care, healing, prognosis, and goals for therapy Person educated: Patient Education method: Explanation Education comprehension: verbalized understanding  HOME EXERCISE PROGRAM: Today's interventions were added to his HEP and he was able to properly demonstrate these exercises.  He was encouraged to complete these interventions twice per day and reported understanding.  ASSESSMENT:  CLINICAL IMPRESSION: Pt arrives for today's treatment session reporting 5/10 low back pain.   Pt reports that he feels better for about two hours after therapy, but the pain returns at full strength.  Pt encouraged to call MD and schedule an appointment for later next week in order to get the process started for MRI.  Pt instructed in standing exercises today which caused left foot to go numb.  Exercises discontinued upon development of numbness.  Normal responses to estim and MH noted upon removal.  Pt reported 3/10 low back pain at completion of today's treatment session.   OBJECTIVE IMPAIRMENTS: decreased activity tolerance, decreased mobility, difficulty walking, decreased ROM, decreased strength, impaired tone, and pain.   ACTIVITY LIMITATIONS: carrying, lifting, standing, squatting, and locomotion level  PARTICIPATION LIMITATIONS: meal prep, cleaning, laundry, shopping, community activity, occupation, and yard work  PERSONAL FACTORS:  Profession, Time since onset of injury/illness/exacerbation, and 1 comorbidity: history of a bladder tumor  are also affecting patient's functional outcome.   REHAB POTENTIAL: Fair    CLINICAL DECISION MAKING: Evolving/moderate complexity  EVALUATION COMPLEXITY: Moderate   GOALS: Goals reviewed with patient? Yes  LONG TERM GOALS: Target date: 10/01/22  Patient will be independent with his HEP.  Baseline:  Goal status: INITIAL  2.  Patient will be able to complete his daily activities without his familiar symptoms exceeding 6/10. Baseline:  Goal status: INITIAL  3.  Patient will be able to complete his critical job demands without being limited  by his low back and lower extremity symptoms. Baseline:  Goal status: INITIAL  4.  Patient will be able to demonstrate at least 20 degrees of lumbar sidebending bilaterally for improved lumbar mobility. Baseline:  Goal status: INITIAL  PLAN:  PT FREQUENCY: 2x/week  PT DURATION: 4 weeks  PLANNED INTERVENTIONS: Therapeutic exercises, Therapeutic activity, Neuromuscular re-education, Balance training, Patient/Family education, Self Care, Joint mobilization, Dry Needling, Electrical stimulation, Spinal mobilization, Cryotherapy, Moist heat, Traction, Ultrasound, Manual therapy, and Re-evaluation.  PLAN FOR NEXT SESSION: NuStep, lumbar stabilization and strengthening, mechanical traction, manual therapy, and modalities as needed   Newman Pies, PTA 09/15/2022, 9:07 AM

## 2022-09-15 NOTE — Telephone Encounter (Signed)
Placed referral to orthopedic for the patient 

## 2022-09-15 NOTE — Telephone Encounter (Signed)
REFERRAL REQUEST Telephone Note  Have you been seen at our office for this problem? YES - 08-04-22 (Advise that they may need an appointment with their PCP before a referral can be done)  Reason for Referral: Lower Back Pain Referral discussed with patient: YES  Best contact number of patient for referral team: [phone]    Has patient been seen by a specialist for this issue before: NO  Patient provider preference for referral: N/A Patient location preference for referral: N/A   Patient notified that referrals can take up to a week or longer to process. If they haven't heard anything within a week they should call back and speak with the referral department.

## 2022-09-16 NOTE — Telephone Encounter (Signed)
Tried calling pt. He did not answer and vmail was not set up.

## 2022-09-17 ENCOUNTER — Ambulatory Visit: Payer: BC Managed Care – PPO

## 2022-09-17 DIAGNOSIS — M5416 Radiculopathy, lumbar region: Secondary | ICD-10-CM

## 2022-09-17 NOTE — Therapy (Signed)
OUTPATIENT PHYSICAL THERAPY THORACOLUMBAR TREATMENT   Patient Name: Ryan Conner MRN: [id number] DOB:21-Mar-1981, 42 y.o., male Today's Date: 09/17/2022  END OF SESSION:  PT End of Session - 09/17/22 0823     Visit Number 5    Number of Visits 8    Date for PT Re-Evaluation 10/10/22    PT Start Time 0815    PT Stop Time 0905    PT Time Calculation (min) 50 min    Activity Tolerance Patient tolerated treatment well    Behavior During Therapy [facility] for tasks assessed/performed              Past Medical History:  Diagnosis Date   Arrhythmia    Deep vein thrombosis (DVT) (HCC)    Hyperlipidemia    Status post placement of implantable loop recorder 2014   Medtronic Reveal LINQ - implanted in [place]   Past Surgical History:  Procedure Laterality Date   CYSTOSCOPY N/A 02/21/2021   Procedure: CYSTOSCOPY;  Surgeon: Malen Gauze, MD;  Location: [facility];  Service: Urology;  Laterality: N/A;   CYSTOSCOPY W/ RETROGRADES Bilateral 10/15/2020   Procedure: CYSTOSCOPY WITH RETROGRADE PYELOGRAM;  Surgeon: Malen Gauze, MD;  Location: [facility];  Service: Urology;  Laterality: Bilateral;   Implantable loop recorder  2014   KNEE SURGERY Right    LOOP RECORDER REMOVAL     TRANSURETHRAL RESECTION OF BLADDER TUMOR N/A 10/15/2020   Procedure: TRANSURETHRAL RESECTION OF BLADDER TUMOR (TURBT);  Surgeon: Malen Gauze, MD;  Location: [facility];  Service: Urology;  Laterality: N/A;   TRANSURETHRAL RESECTION OF BLADDER TUMOR N/A 02/21/2021   Procedure: TRANSURETHRAL RESECTION OF BLADDER TUMOR (TURBT);  Surgeon: Malen Gauze, MD;  Location: [facility];  Service: Urology;  Laterality: N/A;   varicose veins removed   2012   bilateral legs    Patient Active Problem List   Diagnosis Date Noted   Malignant neoplasm of lateral wall of urinary bladder (HCC) 10/22/2020   Status post placement of implantable loop recorder 09/25/2020   Occipital neuralgia of right side 07/04/2020   Chronic  tension-type headache, not intractable 06/11/2020   REFERRING PROVIDER: Dettinger, Elige Radon, MD   REFERRING DIAG: Left sciatic nerve pain   Rationale for Evaluation and Treatment: Rehabilitation  THERAPY DIAG:  Radiculopathy, lumbar region  ONSET DATE: 2023  SUBJECTIVE:                                                                                                                                                                                           SUBJECTIVE STATEMENT: Pt reports 5/10 low back pain today.  Pt reports  that he feels a couple hours of relief after therapy, but pain comes back.   PERTINENT HISTORY:  History of a bladder tumor  PAIN:  Are you having pain? Yes: NPRS scale: 5/10 Pain location: left low back and down left leg (pain) with numbness in BLE Pain description: numbness, shooting, aching  Aggravating factors: standing, walking, kneeling,  Relieving factors: sitting for a while   PRECAUTIONS: None  WEIGHT BEARING RESTRICTIONS: No  FALLS:  Has patient fallen in last 6 months? No  LIVING ENVIRONMENT: Lives with: lives with their family Lives in: House/apartment Has following equipment at home: None  OCCUPATION: cable guy: 8 hour shift, primarily standing, walking, with occasional carrying a ladder  PLOF: Independent  PATIENT GOALS: reduced pain and numbness, be able to stand and walk longer, and be able to do his normal activities without pain  NEXT MD VISIT: 11/06/22  OBJECTIVE: all objective measures were assessed at his initial evaluation on 09/03/22 unless otherwise noted  PATIENT SURVEYS:  FOTO 56.31  SCREENING FOR RED FLAGS: Bowel or bladder incontinence: No Spinal tumors: No Cauda equina syndrome: No Compression fracture: No Abdominal aneurysm: No  COGNITION: Overall cognitive status: Within functional limits for tasks assessed     SENSATION: Light touch: Impaired  and diminished sensation in right foot and ankle Patient  reports numbness in both feet currently.   PALPATION: TTP: left piriformis, right lumbar paraspinals, and QL  LUMBAR JOINT MOBILITY:  WFL throughout with L1-2 reproducing his familiar shooting pain  LUMBAR ROM:   AROM eval  Flexion 72; slight pain   Extension 10; "tight"   Right lateral flexion 8  Left lateral flexion 14  Right rotation WFL  Left rotation 25% limited   (Blank rows = not tested)  LOWER EXTREMITY ROM: WFL for activities assessed  LOWER EXTREMITY MMT:    MMT Right eval Left eval  Hip flexion 4+/5 4+/5  Hip extension    Hip abduction    Hip adduction    Hip internal rotation    Hip external rotation    Knee flexion 4+/5 4+/5  Knee extension 5/5 4+/5  Ankle dorsiflexion 4-/5 4-/5  Ankle plantarflexion    Ankle inversion    Ankle eversion     (Blank rows = not tested)  LUMBAR SPECIAL TESTS:  Straight leg raise test: Negative, Slump test: Positive, and FABER test: Positive  GAIT: Assistive device utilized: None Level of assistance: Complete Independence Comments: no significant gait deviations  TODAY'S TREATMENT:                                                                                                                              DATE:       5/8 EXERCISE LOG  Exercise Repetitions and Resistance Comments  Nustep  L7 x 15 minutes   Cybex Knee Flexion 50# x3 mins   Cybex Knee Extension 20# x3 mins   Cybex Leg Press 2 plates; seat 8  x 3 mins   Rocker board  3 minutes   Standing Hip Abduction x25 reps bil   Standing Marches x25 reps bil   [employer] Rollout    Bridge     SLR     Thomas stretch   LLE only   Lower trunk rotation    Figure 4 stretch      Blank cell = exercise not performed today   Modalities: no redness or adverse reaction to today's modalities  Date:  Unattended Estim: Lumbar, pre mod @ 80-150 Hz, 15 mins, Pain Hot Pack: Lumbar, 15 mins, Pain                          PATIENT EDUCATION:  Education details:  HEP, plan of care, healing, prognosis, and goals for therapy Person educated: Patient Education method: Explanation Education comprehension: verbalized understanding  HOME EXERCISE PROGRAM: Today's interventions were added to his HEP and he was able to properly demonstrate these exercises.  He was encouraged to complete these interventions twice per day and reported understanding.  ASSESSMENT:  CLINICAL IMPRESSION: Pt arrives for today's treatment session reporting 5/10 left sided low back pain.  Pt reports he has had no change with his back pain since beginning therapy.  Pt introduced to cybex machinery today with minimal discomfort.  Pt reports some numbing of bil feet with knee flexion exercises, but able to perform all reps asked of him.  Normal responses to esitm and MH noted upon removal.  Pt reports 4/10 left low back pain at completion of today's treatment session.  OBJECTIVE IMPAIRMENTS: decreased activity tolerance, decreased mobility, difficulty walking, decreased ROM, decreased strength, impaired tone, and pain.   ACTIVITY LIMITATIONS: carrying, lifting, standing, squatting, and locomotion level  PARTICIPATION LIMITATIONS: meal prep, cleaning, laundry, shopping, community activity, occupation, and yard work  PERSONAL FACTORS: Profession, Time since onset of injury/illness/exacerbation, and 1 comorbidity: history of a bladder tumor  are also affecting patient's functional outcome.   REHAB POTENTIAL: Fair    CLINICAL DECISION MAKING: Evolving/moderate complexity  EVALUATION COMPLEXITY: Moderate   GOALS: Goals reviewed with patient? Yes  LONG TERM GOALS: Target date: 10/01/22  Patient will be independent with his HEP.  Baseline:  Goal status: IN PROGRESS  2.  Patient will be able to complete his daily activities without his familiar symptoms exceeding 6/10. Baseline:  Goal status: IN PROGRESS  3.  Patient will be able to complete his critical job demands without being  limited by his low back and lower extremity symptoms. Baseline:  Goal status: IN PROGRESS  4.  Patient will be able to demonstrate at least 20 degrees of lumbar sidebending bilaterally for improved lumbar mobility. Baseline:  Goal status: IN PROGRESS  PLAN:  PT FREQUENCY: 2x/week  PT DURATION: 4 weeks  PLANNED INTERVENTIONS: Therapeutic exercises, Therapeutic activity, Neuromuscular re-education, Balance training, Patient/Family education, Self Care, Joint mobilization, Dry Needling, Electrical stimulation, Spinal mobilization, Cryotherapy, Moist heat, Traction, Ultrasound, Manual therapy, and Re-evaluation.  PLAN FOR NEXT SESSION: NuStep, lumbar stabilization and strengthening, mechanical traction, manual therapy, and modalities as needed   Newman Pies, PTA 09/17/2022, 9:07 AM

## 2022-09-22 ENCOUNTER — Ambulatory Visit: Payer: BC Managed Care – PPO

## 2022-09-22 ENCOUNTER — Telehealth: Payer: Self-pay | Admitting: Family Medicine

## 2022-09-22 DIAGNOSIS — M5416 Radiculopathy, lumbar region: Secondary | ICD-10-CM

## 2022-09-22 NOTE — Therapy (Signed)
OUTPATIENT PHYSICAL THERAPY THORACOLUMBAR TREATMENT   Patient Name: Ryan Conner MRN: [id number] DOB:08/22/1980, 42 y.o., male Today's Date: 09/22/2022  END OF SESSION:  PT End of Session - 09/22/22 0818     Visit Number 6    Number of Visits 8    Date for PT Re-Evaluation 10/10/22    PT Start Time 0815    PT Stop Time 0902    PT Time Calculation (min) 47 min    Activity Tolerance Patient tolerated treatment well    Behavior During Therapy [facility] for tasks assessed/performed               Past Medical History:  Diagnosis Date   Arrhythmia    Deep vein thrombosis (DVT) (HCC)    Hyperlipidemia    Status post placement of implantable loop recorder 2014   Medtronic Reveal LINQ - implanted in [place]   Past Surgical History:  Procedure Laterality Date   CYSTOSCOPY N/A 02/21/2021   Procedure: CYSTOSCOPY;  Surgeon: Malen Gauze, MD;  Location: [facility];  Service: Urology;  Laterality: N/A;   CYSTOSCOPY W/ RETROGRADES Bilateral 10/15/2020   Procedure: CYSTOSCOPY WITH RETROGRADE PYELOGRAM;  Surgeon: Malen Gauze, MD;  Location: [facility];  Service: Urology;  Laterality: Bilateral;   Implantable loop recorder  2014   KNEE SURGERY Right    LOOP RECORDER REMOVAL     TRANSURETHRAL RESECTION OF BLADDER TUMOR N/A 10/15/2020   Procedure: TRANSURETHRAL RESECTION OF BLADDER TUMOR (TURBT);  Surgeon: Malen Gauze, MD;  Location: [facility];  Service: Urology;  Laterality: N/A;   TRANSURETHRAL RESECTION OF BLADDER TUMOR N/A 02/21/2021   Procedure: TRANSURETHRAL RESECTION OF BLADDER TUMOR (TURBT);  Surgeon: Malen Gauze, MD;  Location: [facility];  Service: Urology;  Laterality: N/A;   varicose veins removed   2012   bilateral legs    Patient Active Problem List   Diagnosis Date Noted   Malignant neoplasm of lateral wall of urinary bladder (HCC) 10/22/2020   Status post placement of implantable loop recorder 09/25/2020   Occipital neuralgia of right side 07/04/2020    Chronic tension-type headache, not intractable 06/11/2020   REFERRING PROVIDER: Dettinger, Elige Radon, MD   REFERRING DIAG: Left sciatic nerve pain   Rationale for Evaluation and Treatment: Rehabilitation  THERAPY DIAG:  Radiculopathy, lumbar region  ONSET DATE: 2023  SUBJECTIVE:                                                                                                                                                                                           SUBJECTIVE STATEMENT: Patient reports that he is not hurting today, but  he is stiff.   PERTINENT HISTORY:  History of a bladder tumor  PAIN:  Are you having pain? Yes: NPRS scale: 0/10 Pain location: left low back and down left leg (pain) with numbness in BLE Pain description: numbness, shooting, aching  Aggravating factors: standing, walking, kneeling,  Relieving factors: sitting for a while   PRECAUTIONS: None  WEIGHT BEARING RESTRICTIONS: No  FALLS:  Has patient fallen in last 6 months? No  LIVING ENVIRONMENT: Lives with: lives with their family Lives in: House/apartment Has following equipment at home: None  OCCUPATION: cable guy: 8 hour shift, primarily standing, walking, with occasional carrying a ladder  PLOF: Independent  PATIENT GOALS: reduced pain and numbness, be able to stand and walk longer, and be able to do his normal activities without pain  NEXT MD VISIT: 11/06/22  OBJECTIVE: all objective measures were assessed at his initial evaluation on 09/03/22 unless otherwise noted  PATIENT SURVEYS:  FOTO 56.31  SCREENING FOR RED FLAGS: Bowel or bladder incontinence: No Spinal tumors: No Cauda equina syndrome: No Compression fracture: No Abdominal aneurysm: No  COGNITION: Overall cognitive status: Within functional limits for tasks assessed     SENSATION: Light touch: Impaired  and diminished sensation in right foot and ankle Patient reports numbness in both feet currently.    PALPATION: TTP: left piriformis, right lumbar paraspinals, and QL  LUMBAR JOINT MOBILITY:  WFL throughout with L1-2 reproducing his familiar shooting pain  LUMBAR ROM:   AROM eval  Flexion 72; slight pain   Extension 10; "tight"   Right lateral flexion 8  Left lateral flexion 14  Right rotation WFL  Left rotation 25% limited   (Blank rows = not tested)  LOWER EXTREMITY ROM: WFL for activities assessed  LOWER EXTREMITY MMT:    MMT Right eval Left eval  Hip flexion 4+/5 4+/5  Hip extension    Hip abduction    Hip adduction    Hip internal rotation    Hip external rotation    Knee flexion 4+/5 4+/5  Knee extension 5/5 4+/5  Ankle dorsiflexion 4-/5 4-/5  Ankle plantarflexion    Ankle inversion    Ankle eversion     (Blank rows = not tested)  LUMBAR SPECIAL TESTS:  Straight leg raise test: Negative, Slump test: Positive, and FABER test: Positive  GAIT: Assistive device utilized: None Level of assistance: Complete Independence Comments: no significant gait deviations  TODAY'S TREATMENT:                                                                                                                              DATE:                                     5/13 EXERCISE LOG  Exercise Repetitions and Resistance Comments  Nustep  L3-4 x 16 minutes   Cybex knee extension 40#  x 1 minute; 30# x 1 minutes   Cybex knee flexion  40# x 2 minutes  Reproduces familiar left foot numbness  Ball roll out  3 minutes   Gastroc stretch  2 minutes    Blank cell = exercise not performed today  Modalities: no redness or adverse reaction to today's modalities  Date:  Unattended Estim: Lumbar, IFC @ 80-150 Hz w/ 40% scan, 15 mins, Pain Hot Pack: Lumbar, 15 mins, Pain      5/8 EXERCISE LOG  Exercise Repetitions and Resistance Comments  Nustep  L7 x 15 minutes   Cybex Knee Flexion 50# x3 mins   Cybex Knee Extension 20# x3 mins   Cybex Leg Press 2 plates; seat 8 x 3 mins    Rocker board  3 minutes   Standing Hip Abduction x25 reps bil   Standing Marches x25 reps bil   [employer] Rollout    Bridge     SLR     Thomas stretch   LLE only   Lower trunk rotation    Figure 4 stretch      Blank cell = exercise not performed today   Modalities: no redness or adverse reaction to today's modalities  Date:  Unattended Estim: Lumbar, pre mod @ 80-150 Hz, 15 mins, Pain Hot Pack: Lumbar, 15 mins, Pain                          PATIENT EDUCATION:  Education details: HEP, plan of care, healing, prognosis, and goals for therapy Person educated: Patient Education method: Explanation Education comprehension: verbalized understanding  HOME EXERCISE PROGRAM:   ASSESSMENT:  CLINICAL IMPRESSION: Patient presented to treatment with no pain or numbness. However, his familiar numbness was reproduced with resisted knee flexion. This numbness persisted throughout treatment with today's modalities being the most effective at reducing these symptoms. He reported feeling about the same upon the conclusion of treatment. He may benefit from additional medical intervention to address his impairments to return to his prior level of function.   OBJECTIVE IMPAIRMENTS: decreased activity tolerance, decreased mobility, difficulty walking, decreased ROM, decreased strength, impaired tone, and pain.   ACTIVITY LIMITATIONS: carrying, lifting, standing, squatting, and locomotion level  PARTICIPATION LIMITATIONS: meal prep, cleaning, laundry, shopping, community activity, occupation, and yard work  PERSONAL FACTORS: Profession, Time since onset of injury/illness/exacerbation, and 1 comorbidity: history of a bladder tumor  are also affecting patient's functional outcome.   REHAB POTENTIAL: Fair    CLINICAL DECISION MAKING: Evolving/moderate complexity  EVALUATION COMPLEXITY: Moderate   GOALS: Goals reviewed with patient? Yes  LONG TERM GOALS: Target date:  10/01/22  Patient will be independent with his HEP.  Baseline:  Goal status: IN PROGRESS  2.  Patient will be able to complete his daily activities without his familiar symptoms exceeding 6/10. Baseline:  Goal status: IN PROGRESS  3.  Patient will be able to complete his critical job demands without being limited by his low back and lower extremity symptoms. Baseline:  Goal status: IN PROGRESS  4.  Patient will be able to demonstrate at least 20 degrees of lumbar sidebending bilaterally for improved lumbar mobility. Baseline:  Goal status: IN PROGRESS  PLAN:  PT FREQUENCY: 2x/week  PT DURATION: 4 weeks  PLANNED INTERVENTIONS: Therapeutic exercises, Therapeutic activity, Neuromuscular re-education, Balance training, Patient/Family education, Self Care, Joint mobilization, Dry Needling, Electrical stimulation, Spinal mobilization, Cryotherapy, Moist heat, Traction, Ultrasound, Manual therapy, and Re-evaluation.  PLAN  FOR NEXT SESSION: NuStep, lumbar stabilization and strengthening, mechanical traction, manual therapy, and modalities as needed   Granville Lewis, PT 09/22/2022, 10:06 AM

## 2022-09-22 NOTE — Telephone Encounter (Signed)
Patient walked in today and patient had this taken care of

## 2022-09-22 NOTE — Telephone Encounter (Signed)
REFERRAL REQUEST Telephone Note  Have you been seen at our office for this problem? yes (Advise that they may need an appointment with their PCP before a referral can be done)  Reason for Referral: lower back pain/feet numbness Referral discussed with patient: yes  Best contact number of patient for referral team:   [phone]      Has patient been seen by a specialist for this issue before: no  Patient provider preference for referral: none Patient location preference for referral: none   Patient notified that referrals can take up to a week or longer to process. If they haven't heard anything within a week they should call back and speak with the referral department.    Please call pt today about this!

## 2022-10-01 NOTE — Telephone Encounter (Signed)
Left message making pt aware that Dr. Louanne Skye has put in the referral to [facility].  Number given to their office and advised pt to call them to see why an appt has not been scheduled.

## 2022-10-01 NOTE — Telephone Encounter (Signed)
Please call the patient and ask them if they have gotten a call from [facility], we placed a referral a little while ago and if that is what he is waiting on still then please give him the number so he can contact them and we can also have our referral specialist contact them as well.

## 2022-10-14 IMAGING — DX DG KNEE 1-2V*R*
2 series · 2 of 2 positions shown · non-contrast
Comparison: None.

CLINICAL DATA: Right knee pain

EXAM:
RIGHT KNEE - 1-2 VIEW

[knee ap]
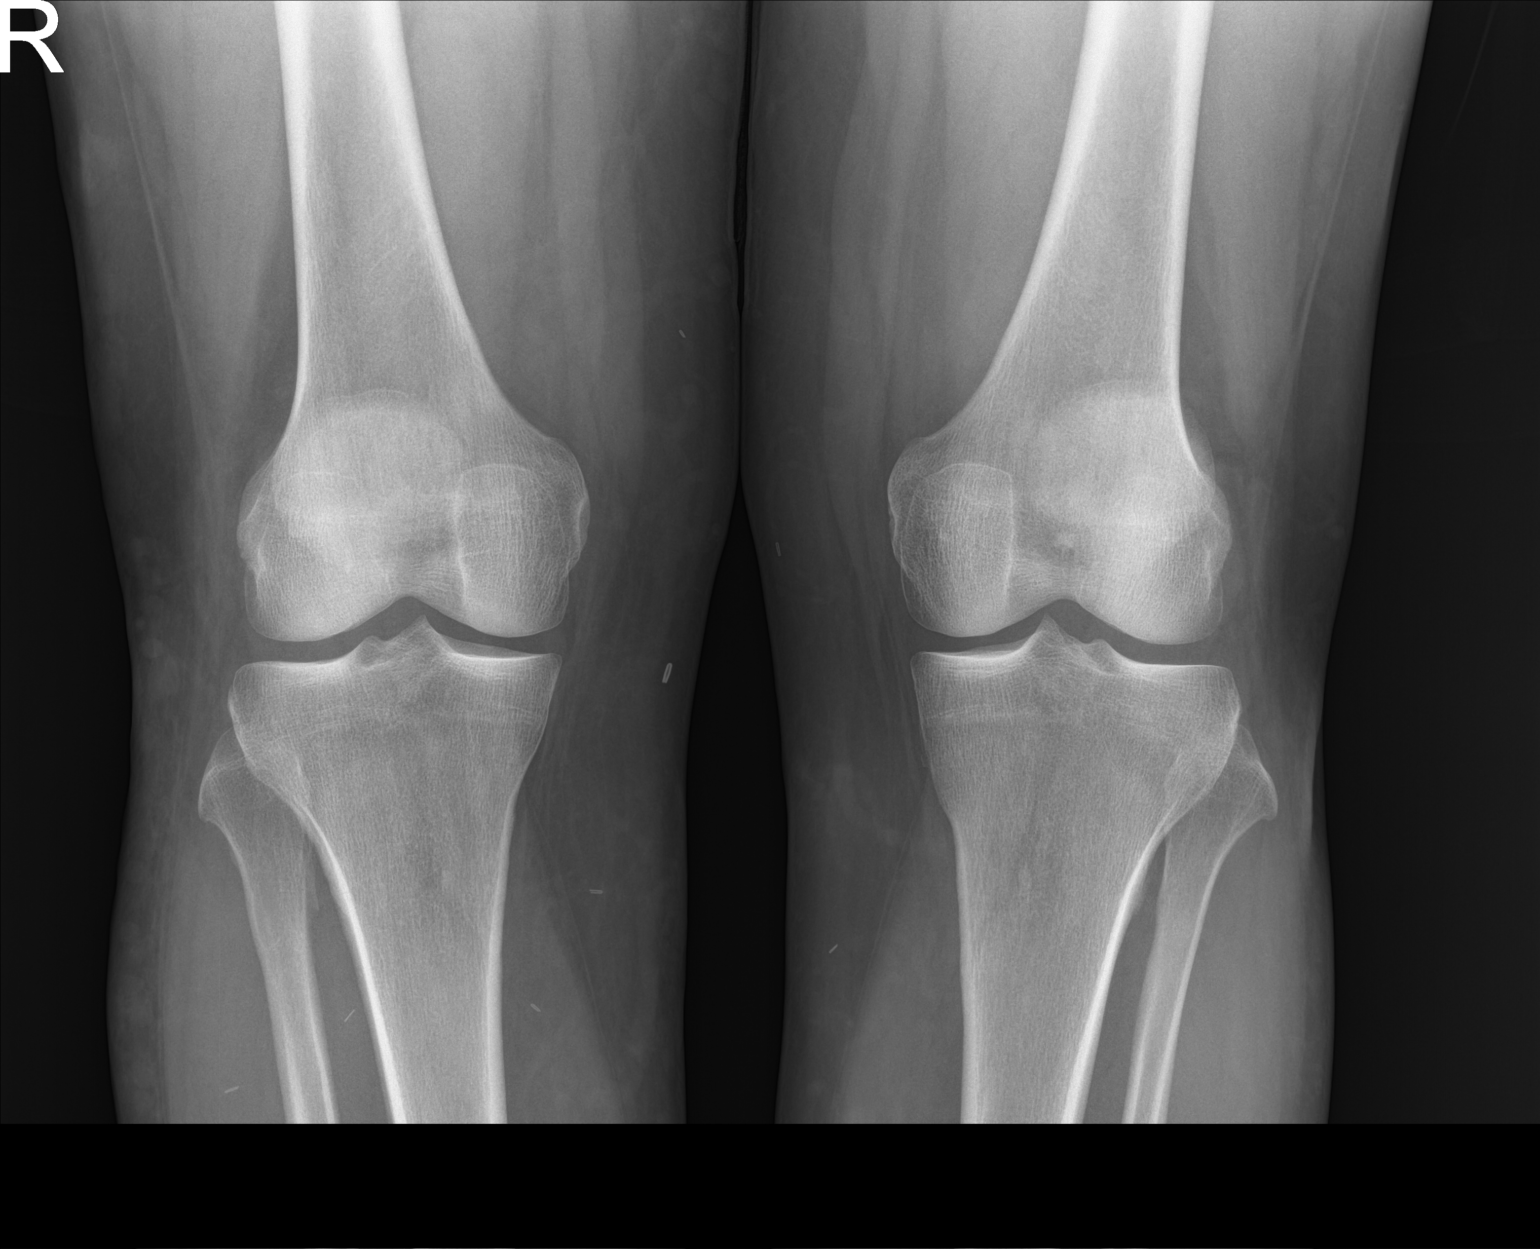

[knee lat]
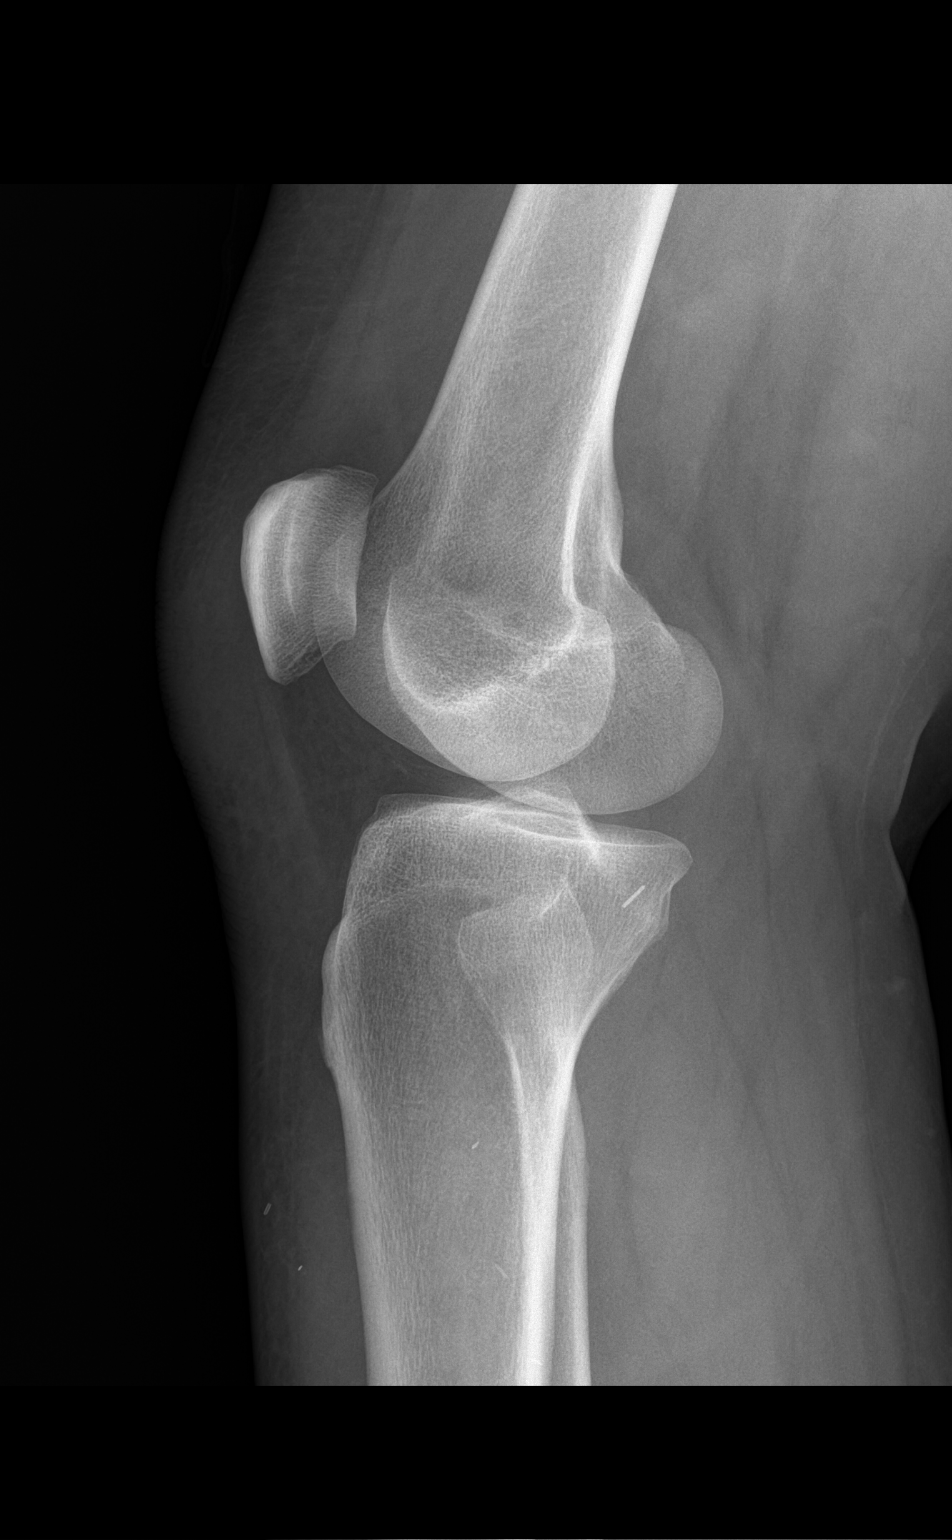

[2 of 2 positions shown; findings below may reference images not displayed]

FINDINGS: No evidence of fracture, dislocation, or joint effusion. No evidence
of arthropathy or other focal bone abnormality. If there is
significant prepatellar soft tissue swelling. Varicose veins are
noted.
IMPRESSION: 1. No acute osseous abnormality.
2. Prepatellar soft tissue swelling, nonspecific.
3. Varicose veins are noted. No evidence of knee joint effusion.

## 2022-10-15 DIAGNOSIS — M5451 Vertebrogenic low back pain: Secondary | ICD-10-CM | POA: Diagnosis not present

## 2022-10-30 DIAGNOSIS — M5451 Vertebrogenic low back pain: Secondary | ICD-10-CM | POA: Diagnosis not present

## 2022-11-04 DIAGNOSIS — M5451 Vertebrogenic low back pain: Secondary | ICD-10-CM | POA: Diagnosis not present

## 2022-11-04 DIAGNOSIS — M791 Myalgia, unspecified site: Secondary | ICD-10-CM | POA: Diagnosis not present

## 2022-11-06 ENCOUNTER — Encounter: Payer: Self-pay | Admitting: Family Medicine

## 2022-11-06 ENCOUNTER — Ambulatory Visit (INDEPENDENT_AMBULATORY_CARE_PROVIDER_SITE_OTHER): Payer: BC Managed Care – PPO | Admitting: Family Medicine

## 2022-11-06 VITALS — BP 120/82 | HR 71 | Ht 72.0 in | Wt 281.0 lb

## 2022-11-06 DIAGNOSIS — Z6838 Body mass index (BMI) 38.0-38.9, adult: Secondary | ICD-10-CM

## 2022-11-06 DIAGNOSIS — Z0000 Encounter for general adult medical examination without abnormal findings: Secondary | ICD-10-CM

## 2022-11-06 DIAGNOSIS — M791 Myalgia, unspecified site: Secondary | ICD-10-CM | POA: Diagnosis not present

## 2022-11-06 DIAGNOSIS — E6609 Other obesity due to excess calories: Secondary | ICD-10-CM | POA: Diagnosis not present

## 2022-11-06 DIAGNOSIS — Z0001 Encounter for general adult medical examination with abnormal findings: Secondary | ICD-10-CM | POA: Diagnosis not present

## 2022-11-06 LAB — CBC WITH DIFFERENTIAL/PLATELET
Basos: 0 %
EOS (ABSOLUTE): 0 10*3/uL (ref 0.0–0.4)
Immature Grans (Abs): 0 10*3/uL (ref 0.0–0.1)
Lymphs: 27 %
MCHC: 32.6 g/dL (ref 31.5–35.7)
MCV: 83 fL (ref 79–97)
Neutrophils Absolute: 4.7 10*3/uL (ref 1.4–7.0)
Platelets: 280 10*3/uL (ref 150–450)
WBC: 7 10*3/uL (ref 3.4–10.8)

## 2022-11-06 LAB — NMR, LIPOPROFILE

## 2022-11-06 LAB — CMP14+EGFR
ALT: 37 IU/L (ref 0–44)
Creatinine, Ser: 0.92 mg/dL (ref 0.76–1.27)
Potassium: 4.5 mmol/L (ref 3.5–5.2)
Sodium: 137 mmol/L (ref 134–144)
eGFR: 107 mL/min/{1.73_m2} (ref >59)

## 2022-11-06 LAB — BAYER DCA HB A1C WAIVED: HB A1C (BAYER DCA - WAIVED): 5.7 % — ABNORMAL HIGH (ref 4.8–5.6)

## 2022-11-06 NOTE — Progress Notes (Signed)
BP 120/82   Pulse 71   Ht 6' (1.829 m)   Wt 281 lb (127.5 kg)   SpO2 97%   BMI 38.11 kg/m    Subjective:   Patient ID: Ryan Conner, male    DOB: 1981-05-10, 42 y.o.   MRN: [id number]  HPI: Ryan Conner is a 42 y.o. male presenting on 11/06/2022 for Medical Management of Chronic Issues (CPE)   HPI Physical exam Patient denies any chest pain, shortness of breath, headaches or vision issues, abdominal complaints, diarrhea, nausea, vomiting.  Patient is seeing an orthopedic for issues with his back and his shoulder and is trying to get the MRI for shoulder and is looking at injections for his back.  Other than that healthwise he does seem to be doing pretty well.  Relevant past medical, surgical, family and social history reviewed and updated as indicated. Interim medical history since our last visit reviewed. Allergies and medications reviewed and updated.  Review of Systems  Constitutional:  Negative for chills and fever.  HENT:  Negative for ear pain and tinnitus.   Eyes:  Negative for pain.  Respiratory:  Negative for cough, shortness of breath and wheezing.   Cardiovascular:  Negative for chest pain, palpitations and leg swelling.  Gastrointestinal:  Negative for abdominal pain, blood in stool, constipation and diarrhea.  Genitourinary:  Negative for dysuria and hematuria.  Musculoskeletal:  Negative for back pain and myalgias.  Skin:  Negative for rash.  Neurological:  Negative for dizziness, weakness and headaches.  Psychiatric/Behavioral:  Negative for suicidal ideas.     Per HPI unless specifically indicated above   Allergies as of 11/06/2022   No Known Allergies      Medication List        Accurate as of November 06, 2022 11:06 AM. If you have any questions, ask your nurse or doctor.          STOP taking these medications    amoxicillin 500 MG capsule Commonly known as: AMOXIL Stopped by: Elige Radon Kaithlyn Teagle, MD       TAKE these medications     predniSONE 20 MG tablet Commonly known as: DELTASONE 2 po at same time daily for 5 days         Objective:   BP 120/82   Pulse 71   Ht 6' (1.829 m)   Wt 281 lb (127.5 kg)   SpO2 97%   BMI 38.11 kg/m   Wt Readings from Last 3 Encounters:  11/06/22 281 lb (127.5 kg)  08/04/22 274 lb (124.3 kg)  09/11/21 261 lb (118.4 kg)    Physical Exam Vitals reviewed.  Constitutional:      General: He is not in acute distress.    Appearance: He is well-developed. He is not diaphoretic.  HENT:     Right Ear: External ear normal.     Left Ear: External ear normal.     Nose: Nose normal.     Mouth/Throat:     Pharynx: No oropharyngeal exudate.  Eyes:     General: No scleral icterus.    Conjunctiva/sclera: Conjunctivae normal.  Neck:     Thyroid: No thyromegaly.  Cardiovascular:     Rate and Rhythm: Normal rate and regular rhythm.     Heart sounds: Normal heart sounds. No murmur heard. Pulmonary:     Effort: Pulmonary effort is normal. No respiratory distress.     Breath sounds: Normal breath sounds. No wheezing.  Abdominal:     General: Bowel  sounds are normal. There is no distension.     Palpations: Abdomen is soft.     Tenderness: There is no abdominal tenderness. There is no guarding or rebound.  Musculoskeletal:        General: No swelling.     Cervical back: Neck supple.  Lymphadenopathy:     Cervical: No cervical adenopathy.  Skin:    General: Skin is warm and dry.     Findings: No rash.  Neurological:     Mental Status: He is alert and oriented to person, place, and time.     Coordination: Coordination normal.  Psychiatric:        Behavior: Behavior normal.       Assessment & Plan:   Problem List Items Addressed This Visit   None Visit Diagnoses     Physical exam    -  Primary   Relevant Orders   CBC with Differential/Platelet   CMP14+EGFR   NMR, lipoprofile   Bayer DCA Hb A1c Waived   Class 2 obesity due to excess calories without serious  comorbidity with body mass index (BMI) of 38.0 to 38.9 in adult       Relevant Orders   CBC with Differential/Platelet   CMP14+EGFR   NMR, lipoprofile   Bayer DCA Hb A1c Waived       Will check blood work today, seems to be doing well except for arthritic pains in his shoulder and his back.  Recommended continuing doing exercises and stretches. Follow up plan: Return in about 1 year (around 11/06/2023), or if symptoms worsen or fail to improve, for Physical exam.  Counseling provided for all of the vaccine components Orders Placed This Encounter  Procedures   CBC with Differential/Platelet   CMP14+EGFR   NMR, lipoprofile   Bayer DCA Hb A1c Waived    Arville Care, MD [facility] 11/06/2022, 11:06 AM

## 2022-11-07 LAB — CBC WITH DIFFERENTIAL/PLATELET
Basophils Absolute: 0 10*3/uL (ref 0.0–0.2)
Eos: 0 %
Hematocrit: 45.4 % (ref 37.5–51.0)
Hemoglobin: 14.8 g/dL (ref 13.0–17.7)
Immature Granulocytes: 0 %
Lymphocytes Absolute: 1.9 10*3/uL (ref 0.7–3.1)
MCH: 27 pg (ref 26.6–33.0)
Monocytes Absolute: 0.4 10*3/uL (ref 0.1–0.9)
Monocytes: 5 %
Neutrophils: 68 %
RBC: 5.48 x10E6/uL (ref 4.14–5.80)
RDW: 13 % (ref 11.6–15.4)

## 2022-11-07 LAB — NMR, LIPOPROFILE
Cholesterol, Total: 270 mg/dL — ABNORMAL HIGH (ref 100–199)
HDL Particle Number: 33.6 umol/L (ref >=30.5)
LP-IR Score: 82 — ABNORMAL HIGH (ref <=45)

## 2022-11-07 LAB — CMP14+EGFR
AST: 16 IU/L (ref 0–40)
Albumin: 4.3 g/dL (ref 4.1–5.1)
Alkaline Phosphatase: 116 IU/L (ref 44–121)
BUN/Creatinine Ratio: 20 (ref 9–20)
BUN: 18 mg/dL (ref 6–24)
Bilirubin Total: 0.3 mg/dL (ref 0.0–1.2)
CO2: 21 mmol/L (ref 20–29)
Calcium: 9.2 mg/dL (ref 8.7–10.2)
Chloride: 102 mmol/L (ref 96–106)
Globulin, Total: 2.6 g/dL (ref 1.5–4.5)
Glucose: 99 mg/dL (ref 70–99)
Total Protein: 6.9 g/dL (ref 6.0–8.5)

## 2022-11-12 ENCOUNTER — Encounter: Payer: Self-pay | Admitting: Family Medicine

## 2022-11-12 ENCOUNTER — Ambulatory Visit (INDEPENDENT_AMBULATORY_CARE_PROVIDER_SITE_OTHER): Payer: BC Managed Care – PPO

## 2022-11-12 ENCOUNTER — Ambulatory Visit (INDEPENDENT_AMBULATORY_CARE_PROVIDER_SITE_OTHER): Payer: BC Managed Care – PPO | Admitting: Family Medicine

## 2022-11-12 VITALS — BP 123/82 | HR 70 | Ht 72.0 in | Wt 280.0 lb

## 2022-11-12 DIAGNOSIS — S4991XA Unspecified injury of right shoulder and upper arm, initial encounter: Secondary | ICD-10-CM | POA: Diagnosis not present

## 2022-11-12 DIAGNOSIS — M25511 Pain in right shoulder: Secondary | ICD-10-CM

## 2022-11-12 NOTE — Progress Notes (Signed)
BP 123/82   Pulse 70   Ht 6' (1.829 m)   Wt 280 lb (127 kg)   SpO2 95%   BMI 37.97 kg/m    Subjective:   Patient ID: Ryan Conner, male    DOB: [birth year], 42 y.o.   MRN: [id number]  HPI: Ryan Conner is a 42 y.o. male presenting on 11/12/2022 for Shoulder Injury (Right. Occurred at work one month ago)   HPI Right shoulder pain Patient has been having right shoulder pain that been bothering him over the past 4 weeks.  He says he was at work and was lifting a ladder and felt something twinge or pop in his shoulder and since then he has been having pain in the anterior aspect of his shoulder that is worse when he lifts things or when he reaches forward for things or when he reaches behind for things.  It hurts in the anterior aspect of the shoulder when it does hurt.  He says he did feel a pop initially.  He says [facility] go through [employer]. but then that was denied so that is what brought him here.  He does have at least 4 more days of prednisone and Flexeril that he is taking that he just started.  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  Relevant past medical, surgical, family and social history reviewed and updated as indicated. Interim medical history since our last visit reviewed. Allergies and medications reviewed and updated.  Review of Systems  Constitutional:  Negative for chills and fever.  Eyes:  Negative for visual disturbance.  Respiratory:  Negative for shortness of breath and wheezing.   Cardiovascular:  Negative for chest pain and leg swelling.  Musculoskeletal:  Positive for arthralgias. Negative for back pain, gait problem and joint swelling.  Skin:  Negative for rash.  Neurological:  Negative for dizziness and light-headedness.  All other systems reviewed and are negative.   Per HPI unless specifically indicated above   Allergies as of  11/12/2022   No Known Allergies      Medication List        Accurate as of November 12, 2022 10:23 AM. If you have any questions, ask your nurse or doctor.          cyclobenzaprine 5 MG tablet Commonly known as: FLEXERIL Take 5 mg by mouth at bedtime.   predniSONE 20 MG tablet Commonly known as: DELTASONE Take 20 mg by mouth daily with breakfast. What changed: Another medication with the same name was removed. Continue taking this medication, and follow the directions you see here. Changed by: Elige Radon Michel Eskelson, MD         Objective:   BP 123/82   Pulse 70   Ht 6' (1.829 m)   Wt 280 lb (127 kg)   SpO2 95%   BMI 37.97 kg/m   Wt Readings from Last 3 Encounters:  11/12/22 280 lb (127 kg)  11/06/22 281 lb (127.5 kg)  08/04/22 274 lb (124.3 kg)    Physical Exam Vitals and nursing note reviewed.  Constitutional:      General: He is not in acute distress.    Appearance: He is well-developed. He is not diaphoretic.  Eyes:     General: No scleral icterus.    Conjunctiva/sclera: Conjunctivae normal.  Neck:     Thyroid: No  thyromegaly.  Musculoskeletal:        General: Normal range of motion.     Right shoulder: Tenderness present. No deformity or crepitus. Normal range of motion.  Skin:    General: Skin is warm and dry.     Findings: No rash.  Neurological:     Mental Status: He is alert and oriented to person, place, and time.     Coordination: Coordination normal.  Psychiatric:        Behavior: Behavior normal.       Assessment & Plan:   Problem List Items Addressed This Visit   None Visit Diagnoses     Right shoulder pain, unspecified chronicity    -  Primary   Relevant Orders   DG Shoulder Right   Ambulatory referral to Physical Therapy       X-ray looks normal, likely biceps tendinitis based on exam, will send for physical therapy.  He is continue taking his prednisone that he has, it looks like he has 4 more days and finished that course.  He  also has muscle relaxer and continue with that.  He will need FMLA for paperwork and he will bring the paperwork back to our office, I would put him out for 3 weeks to start with so he can do the physical therapy and rehab necessary to get his strength up. Follow up plan: Return if symptoms worsen or fail to improve.  Counseling provided for all of the vaccine components Orders Placed This Encounter  Procedures   DG Shoulder Right   Ambulatory referral to Physical Therapy    Arville Care, MD [facility] 11/12/2022, 10:23 AM

## 2022-11-14 ENCOUNTER — Encounter: Payer: Self-pay | Admitting: Family Medicine

## 2022-11-14 ENCOUNTER — Telehealth: Payer: Self-pay | Admitting: Family Medicine

## 2022-11-14 DIAGNOSIS — Z0279 Encounter for issue of other medical certificate: Secondary | ICD-10-CM

## 2022-11-14 NOTE — Telephone Encounter (Signed)
Patient dropped off FMLA forms to be completed and signed.  Form Fee Paid? (Y/N)       YES      If YES, then form will be placed in the RX/HH Nurse Coordinators box for completion.

## 2022-11-17 ENCOUNTER — Other Ambulatory Visit: Payer: Self-pay

## 2022-11-17 MED ORDER — ROSUVASTATIN CALCIUM 5 MG PO TABS: 5.0000 mg | ORAL_TABLET | Freq: Every evening | ORAL | 1 refills | Status: DC

## 2022-11-17 MED ORDER — METFORMIN HCL 500 MG PO TABS: 500.0000 mg | ORAL_TABLET | Freq: Every day | ORAL | 1 refills | Status: DC

## 2022-11-18 NOTE — Telephone Encounter (Signed)
Information completed and forwarded to PCP 

## 2022-11-19 ENCOUNTER — Ambulatory Visit: Payer: BC Managed Care – PPO | Attending: Family Medicine

## 2022-11-19 ENCOUNTER — Other Ambulatory Visit: Payer: Self-pay

## 2022-11-19 DIAGNOSIS — M25511 Pain in right shoulder: Secondary | ICD-10-CM | POA: Diagnosis not present

## 2022-11-19 NOTE — Therapy (Signed)
OUTPATIENT PHYSICAL THERAPY SHOULDER EVALUATION   Patient Name: Ryan Conner MRN: [id number] DOB:[birth year], 42 y.o., male Today's Date: 11/19/2022  END OF SESSION:  PT End of Session - 11/19/22 0850     Visit Number 1    Number of Visits 6    Date for PT Re-Evaluation 01/09/23    PT Start Time 0851    PT Stop Time 0926    PT Time Calculation (min) 35 min    Activity Tolerance Patient tolerated treatment well    Behavior During Therapy [facility] for tasks assessed/performed             Past Medical History:  Diagnosis Date   Arrhythmia    Deep vein thrombosis (DVT) (HCC)    Hyperlipidemia    Status post placement of implantable loop recorder 2014   Medtronic Reveal LINQ - implanted in [place]   Past Surgical History:  Procedure Laterality Date   CYSTOSCOPY N/A 02/21/2021   Procedure: CYSTOSCOPY;  Surgeon: Malen Gauze, MD;  Location: [facility];  Service: Urology;  Laterality: N/A;   CYSTOSCOPY W/ RETROGRADES Bilateral 10/15/2020   Procedure: CYSTOSCOPY WITH RETROGRADE PYELOGRAM;  Surgeon: Malen Gauze, MD;  Location: [facility];  Service: Urology;  Laterality: Bilateral;   Implantable loop recorder  2014   KNEE SURGERY Right    LOOP RECORDER REMOVAL     TRANSURETHRAL RESECTION OF BLADDER TUMOR N/A 10/15/2020   Procedure: TRANSURETHRAL RESECTION OF BLADDER TUMOR (TURBT);  Surgeon: Malen Gauze, MD;  Location: [facility];  Service: Urology;  Laterality: N/A;   TRANSURETHRAL RESECTION OF BLADDER TUMOR N/A 02/21/2021   Procedure: TRANSURETHRAL RESECTION OF BLADDER TUMOR (TURBT);  Surgeon: Malen Gauze, MD;  Location: [facility];  Service: Urology;  Laterality: N/A;   varicose veins removed   2012   bilateral legs    Patient Active Problem List   Diagnosis Date Noted   Malignant neoplasm of lateral wall of urinary bladder (HCC) 10/22/2020   Status post placement of implantable loop recorder 09/25/2020   Occipital neuralgia of right side 07/04/2020   Chronic  tension-type headache, not intractable 06/11/2020   REFERRING PROVIDER: Dettinger, Elige Radon, MD   REFERRING DIAG: Right shoulder pain, unspecified chronicity   THERAPY DIAG:  Acute pain of right shoulder  Rationale for Evaluation and Treatment: Rehabilitation  ONSET DATE: June 2024   SUBJECTIVE:                                                                                                                                                                                      SUBJECTIVE STATEMENT: Patient reports that he felt his right shoulder pop while lifting  at work about a month ago. He notes that it has been better since he started taking Prednisone about 10 days ago. He has noticed range of motion improvements, but it is still not 100% yet. He tried to get works [employer] worker, but his claim was denied. He notes that this is the second time something like this happened to his shoulder, but he went to work about 4 days after this and then re injured his shoulder.  Hand dominance: Right  PERTINENT HISTORY: History of a bladder tumor   PAIN:  Are you having pain? Yes: NPRS scale: 3-4/10 Pain location: right anterior shoulder Pain description: intermittent sharp and feels like it needs to pop Aggravating factors: letting his arm hang, raising his arm, and carrying  Relieving factors: holding his arm close to his body (resolves pain completely)  PRECAUTIONS: None  WEIGHT BEARING RESTRICTIONS: No  FALLS:  Has patient fallen in last 6 months? 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  PATIENT  SURVEYS:  FOTO 57.20  COGNITION: Overall cognitive status: Within functional limits for tasks assessed     SENSATION: Patient reports no numbness or tingling  POSTURE: Forward head and rounded shoulders   UPPER EXTREMITY ROM:   Active ROM Right eval Left eval  Shoulder flexion 140 157  Shoulder extension    Shoulder abduction 134; pain at end range 151  Shoulder adduction    Shoulder internal rotation To T9; most painful "feels like my shoulder is going to pop" To T7  Shoulder external rotation To T4 To T5  Elbow flexion    Elbow extension    Wrist flexion    Wrist extension    Wrist ulnar deviation    Wrist radial deviation    Wrist pronation    Wrist supination    (Blank rows = not tested)  UPPER EXTREMITY MMT:  MMT Right eval Left eval  Shoulder flexion 4/5 4/5  Shoulder extension    Shoulder abduction 4/5 4/5  Shoulder adduction    Shoulder internal rotation 4+/5 5/5  Shoulder external rotation 4+/5 5/5  Middle trapezius    Lower trapezius    Elbow flexion 4+/5 4+/5  Elbow extension 4/5 4+/5  Wrist flexion    Wrist extension    Wrist ulnar deviation    Wrist radial deviation    Wrist pronation    Wrist supination    Grip strength (lbs)    (Blank rows = not tested)  SHOULDER SPECIAL TESTS: Rotator cuff assessment: Drop arm test: negative and Gerber lift off test: positive    JOINT MOBILITY TESTING:  R scapula: no limitation  PALPATION:  Palpation to short head of biceps reproduced his familiar symptoms   TODAY'S TREATMENT:  DATE:                                    11/19/22 EXERCISE LOG  Exercise Repetitions and Resistance Comments  Doorway stretch     Wall slide  Limited due to pain at end range   Functional IR stretch              Blank cell = exercise not performed today   PATIENT  EDUCATION: Education details: POC, healing, prognosis, objective findings, and goals for therapy Person educated: Patient Education method: Explanation Education comprehension: verbalized understanding  HOME EXERCISE PROGRAM: Doorway stretch (3 x 30 seconds)   ASSESSMENT:  CLINICAL IMPRESSION: Patient is a 42 y.o. male who was seen today for physical therapy evaluation and treatment for acute right shoulder pain. He presented with low pain severity and irritability with right shoulder abduction and functional internal rotation reproducing his familiar pain. He was provided a home exercise program which included a doorway stretch. He reported feeling comfortable with this intervention. Recommend that he continue with skilled physical therapy to address his impairments to return to his prior level of function.  OBJECTIVE IMPAIRMENTS: decreased activity tolerance, decreased strength, impaired UE functional use, and pain.   ACTIVITY LIMITATIONS: carrying, lifting, and reach over head  PARTICIPATION LIMITATIONS: occupation  PERSONAL FACTORS: Past/current experiences and 1 comorbidity: History of a bladder tumor   are also affecting patient's functional outcome.   REHAB POTENTIAL: Good  CLINICAL DECISION MAKING: Stable/uncomplicated  EVALUATION COMPLEXITY: Low   GOALS: Goals reviewed with patient? Yes  LONG TERM GOALS: Target date: 12/17/22  Patient will be independent with his HEP. Baseline:  Goal status: INITIAL  2.  Patient will be able to complete his daily activities without his familiar shoulder pain exceeding 1/10. Baseline:  Goal status: INITIAL  3.  Patient will be able to lift and carry at least 25 pounds for improved function with his critical job demands. Baseline:  Goal status: INITIAL  4.  Patient will be able to demonstrate at least 140 degrees of right shoulder abduction for improved function with overhead activity. Baseline:  Goal status:  INITIAL  PLAN:  PT FREQUENCY: 1-2x/week  PT DURATION: 4 weeks  PLANNED INTERVENTIONS: Therapeutic exercises, Therapeutic activity, Neuromuscular re-education, Patient/Family education, Self Care, Joint mobilization, Dry Needling, Electrical stimulation, Cryotherapy, Moist heat, Taping, Vasopneumatic device, Manual therapy, and Re-evaluation  PLAN FOR NEXT SESSION: UBE, rotator cuff strengthening, and modalities as needed   Granville Lewis, PT 11/19/2022, 3:44 PM

## 2022-11-21 ENCOUNTER — Ambulatory Visit: Payer: BC Managed Care – PPO | Admitting: Physical Therapy

## 2022-11-21 NOTE — Telephone Encounter (Signed)
PCP completed and signed STD forms. They have been faxed to [facility] at fax number [phone]. Patient has been contacted and informed they are complete, copy up front.

## 2022-11-24 ENCOUNTER — Ambulatory Visit: Payer: BC Managed Care – PPO | Admitting: Physical Therapy

## 2022-11-24 ENCOUNTER — Encounter: Payer: Self-pay | Admitting: Physical Therapy

## 2022-11-24 DIAGNOSIS — M25511 Pain in right shoulder: Secondary | ICD-10-CM | POA: Diagnosis not present

## 2022-11-24 NOTE — Therapy (Addendum)
OUTPATIENT PHYSICAL THERAPY SHOULDER TREATMENT   Patient Name: Ryan Conner MRN: [id number] DOB:1980/08/30, 42 y.o., male Today's Date: 11/24/2022  END OF SESSION:  PT End of Session - 11/24/22 1300     Visit Number 2    Number of Visits 6    Date for PT Re-Evaluation 01/09/23    PT Start Time 1301    PT Stop Time 1344    PT Time Calculation (min) 43 min    Activity Tolerance Patient tolerated treatment well    Behavior During Therapy WFL for tasks assessed/performed            Past Medical History:  Diagnosis Date   Arrhythmia    Deep vein thrombosis (DVT) (HCC)    Hyperlipidemia    Status post placement of implantable loop recorder 2014   [employer]tronic Reveal LINQ - implanted in [place]   Past Surgical History:  Procedure Laterality Date   CYSTOSCOPY N/A 02/21/2021   Procedure: CYSTOSCOPY;  Surgeon: Malen Gauze, MD;  Location: [facility];  Service: Urology;  Laterality: N/A;   CYSTOSCOPY W/ RETROGRADES Bilateral 10/15/2020   Procedure: CYSTOSCOPY WITH RETROGRADE PYELOGRAM;  Surgeon: Malen Gauze, MD;  Location: [facility];  Service: Urology;  Laterality: Bilateral;   Implantable loop recorder  2014   KNEE SURGERY Right    LOOP RECORDER REMOVAL     TRANSURETHRAL RESECTION OF BLADDER TUMOR N/A 10/15/2020   Procedure: TRANSURETHRAL RESECTION OF BLADDER TUMOR (TURBT);  Surgeon: Malen Gauze, MD;  Location: [facility];  Service: Urology;  Laterality: N/A;   TRANSURETHRAL RESECTION OF BLADDER TUMOR N/A 02/21/2021   Procedure: TRANSURETHRAL RESECTION OF BLADDER TUMOR (TURBT);  Surgeon: Malen Gauze, MD;  Location: [facility];  Service: Urology;  Laterality: N/A;   varicose veins removed   2012   bilateral legs    Patient Active Problem List   Diagnosis Date Noted   Malignant neoplasm of lateral wall of urinary bladder (HCC) 10/22/2020   Status post placement of implantable loop recorder 09/25/2020   Occipital neuralgia of right side 07/04/2020   Chronic  tension-type headache, not intractable 06/11/2020   REFERRING PROVIDER: Dettinger, Elige Radon, MD   REFERRING DIAG: Right shoulder pain, unspecified chronicity   THERAPY DIAG:  Acute pain of right shoulder  Rationale for Evaluation and Treatment: Rehabilitation  ONSET DATE: June 2024   SUBJECTIVE:                                                                                                                                                                                      SUBJECTIVE STATEMENT: States that his shoulder feels "kind of numb" or sensation of knuckles  needing to pop. Hand dominance: Right  PERTINENT HISTORY: History of a bladder tumor   PAIN:  Are you having pain? Yes: NPRS scale: 2/10 Pain location: right anterior shoulder Pain description: intermittent sharp and feels like it needs to pop Aggravating factors: letting his arm hang, raising his arm, and carrying  Relieving factors: holding his arm close to his body (resolves pain completely)  PRECAUTIONS: None  WEIGHT BEARING RESTRICTIONS: No  FALLS:  Has patient fallen in last 6 months? 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  PATIENT SURVEYS:  FOTO 57.20  COGNITION: Overall cognitive status: Within functional limits for tasks assessed     SENSATION: Patient reports no numbness or tingling  POSTURE: Forward head and rounded shoulders   UPPER EXTREMITY ROM:   Active ROM Right eval Left eval  Shoulder flexion 140 157  Shoulder extension    Shoulder abduction 134; pain at end range 151  Shoulder adduction     Shoulder internal rotation To T9; most painful "feels like my shoulder is going to pop" To T7  Shoulder external rotation To T4 To T5  Elbow flexion    Elbow extension    Wrist flexion    Wrist extension    Wrist ulnar deviation    Wrist radial deviation    Wrist pronation    Wrist supination    (Blank rows = not tested)  UPPER EXTREMITY MMT:  MMT Right eval Left eval  Shoulder flexion 4/5 4/5  Shoulder extension    Shoulder abduction 4/5 4/5  Shoulder adduction    Shoulder internal rotation 4+/5 5/5  Shoulder external rotation 4+/5 5/5  Middle trapezius    Lower trapezius    Elbow flexion 4+/5 4+/5  Elbow extension 4/5 4+/5  Wrist flexion    Wrist extension    Wrist ulnar deviation    Wrist radial deviation    Wrist pronation    Wrist supination    Grip strength (lbs)    (Blank rows = not tested)  SHOULDER SPECIAL TESTS: Rotator cuff assessment: Drop arm test: negative and Gerber lift off test: positive    JOINT MOBILITY TESTING:  R scapula: no limitation  PALPATION:  Palpation to short head of biceps reproduced his familiar symptoms   TODAY'S TREATMENT:                                                                                                                                         DATE:  11/24/22 EXERCISE LOG  Exercise Repetitions and Resistance Comments  UBE  90 RPM x8 min Forward/backward; anterior shoulder discomfort  Wall slides X15 reps Anterior shoulder discomfort/painful  Wall wash CW and CCW Anterior shoulder discomfort  Resisted row Red theraband x20 reps Popping around [facility] joint  Resisted extension Red theraband x20 reps   Resisted ER Red theraband x20 reps   Resisted IR Red theraband x20 reps   Resisted protraction Red theraband x20 reps   Shoulder flexion Standing 2# x20 reps Painful  Shoulder scaption Standing 2# x20 reps   Wall walks Red theraband x3 reps Popping       Blank cell = exercise not performed today    Modalities  Date:11/24/22 Unattended Estim: Shoulder, Pre-Mod, 15 mins, Pain Vaso: Shoulder, Low, 15 mins, Pain  PATIENT EDUCATION: Education details: POC, healing, prognosis, objective findings, and goals for therapy Person educated: Patient Education method: Explanation Education comprehension: verbalized understanding  HOME EXERCISE PROGRAM: Doorway stretch (3 x 30 seconds)   ASSESSMENT:  CLINICAL IMPRESSION: Patient presented in clinic with reports of mild R shoulder pain. States that anything overhead is painful or stretching to anterior shoulder. All symptoms indicated by patient are listed in exercise log. Patient denies any pain or difficulty in RUE remains close to his torso such as ER/ IR. Patient reports using ice and heat but has no issues with doorway stretch/HEP. Normal modalities response noted following removal of the modalities.  OBJECTIVE IMPAIRMENTS: decreased activity tolerance, decreased strength, impaired UE functional use, and pain.   ACTIVITY LIMITATIONS: carrying, lifting, and reach over head  PARTICIPATION LIMITATIONS: occupation  PERSONAL FACTORS: Past/current experiences and 1 comorbidity: History of a bladder tumor   are also affecting patient's functional outcome.   REHAB POTENTIAL: Good  CLINICAL DECISION MAKING: Stable/uncomplicated  EVALUATION COMPLEXITY: Low  GOALS: Goals reviewed with patient? Yes  LONG TERM GOALS: Target date: 12/17/22  Patient will be independent with his HEP. Baseline:  Goal status: MET  2.  Patient will be able to complete his daily activities without his familiar shoulder pain exceeding 1/10. Baseline:  Goal status: On-going  3.  Patient will be able to lift and carry at least 25 pounds for improved function with his critical job demands. Baseline:  Goal status: On-going  4.  Patient will be able to demonstrate at least 140 degrees of right shoulder abduction for improved function with overhead  activity. Baseline:  Goal status: On-going  PLAN:  PT FREQUENCY: 1-2x/week  PT DURATION: 4 weeks  PLANNED INTERVENTIONS: Therapeutic exercises, Therapeutic activity, Neuromuscular re-education, Patient/Family education, Self Care, Joint mobilization, Dry Needling, Electrical stimulation, Cryotherapy, Moist heat, Taping, Vasopneumatic device, Manual therapy, and Re-evaluation  PLAN FOR NEXT SESSION: UBE, rotator cuff strengthening, and modalities as needed  Marvell Fuller, PTA 11/24/2022, 3:04 PM

## 2022-11-26 ENCOUNTER — Ambulatory Visit: Payer: BC Managed Care – PPO

## 2022-11-26 DIAGNOSIS — M25511 Pain in right shoulder: Secondary | ICD-10-CM | POA: Diagnosis not present

## 2022-11-26 NOTE — Therapy (Signed)
OUTPATIENT PHYSICAL THERAPY SHOULDER TREATMENT   Patient Name: Ryan Conner MRN: [id number] DOB:20-Feb-1981, 42 y.o., male Today's Date: 11/26/2022  END OF SESSION:  PT End of Session - 11/26/22 0802     Visit Number 3    Number of Visits 6    Date for PT Re-Evaluation 01/09/23    PT Start Time 0800    PT Stop Time 0852    PT Time Calculation (min) 52 min    Activity Tolerance Patient tolerated treatment well    Behavior During Therapy [facility] for tasks assessed/performed             Past Medical History:  Diagnosis Date   Arrhythmia    Deep vein thrombosis (DVT) (HCC)    Hyperlipidemia    Status post placement of implantable loop recorder 2014   [employer] Reveal LINQ - implanted in [place]   Past Surgical History:  Procedure Laterality Date   CYSTOSCOPY N/A 02/21/2021   Procedure: CYSTOSCOPY;  Surgeon: Malen Gauze, MD;  Location: [facility];  Service: Urology;  Laterality: N/A;   CYSTOSCOPY W/ RETROGRADES Bilateral 10/15/2020   Procedure: CYSTOSCOPY WITH RETROGRADE PYELOGRAM;  Surgeon: Malen Gauze, MD;  Location: [facility];  Service: Urology;  Laterality: Bilateral;   Implantable loop recorder  2014   KNEE SURGERY Right    LOOP RECORDER REMOVAL     TRANSURETHRAL RESECTION OF BLADDER TUMOR N/A 10/15/2020   Procedure: TRANSURETHRAL RESECTION OF BLADDER TUMOR (TURBT);  Surgeon: Malen Gauze, MD;  Location: [facility];  Service: Urology;  Laterality: N/A;   TRANSURETHRAL RESECTION OF BLADDER TUMOR N/A 02/21/2021   Procedure: TRANSURETHRAL RESECTION OF BLADDER TUMOR (TURBT);  Surgeon: Malen Gauze, MD;  Location: [facility];  Service: Urology;  Laterality: N/A;   varicose veins removed   2012   bilateral legs    Patient Active Problem List   Diagnosis Date Noted   Malignant neoplasm of lateral wall of urinary bladder (HCC) 10/22/2020   Status post placement of implantable loop recorder 09/25/2020   Occipital neuralgia of right side 07/04/2020   Chronic  tension-type headache, not intractable 06/11/2020   REFERRING PROVIDER: Dettinger, Elige Radon, MD   REFERRING DIAG: Right shoulder pain, unspecified chronicity   THERAPY DIAG:  Acute pain of right shoulder  Rationale for Evaluation and Treatment: Rehabilitation  ONSET DATE: June 2024   SUBJECTIVE:                                                                                                                                                                                      SUBJECTIVE STATEMENT: Patient reports that his shoulder is not really hurting right now,  but it still bothers him when he does certain motions such as reaching overhead. He also feels like his shoulder needs to pop.  Hand dominance: Right  PERTINENT HISTORY: History of a bladder tumor   PAIN:  Are you having pain? Yes: NPRS scale: 0/10 Pain location: right anterior shoulder Pain description: intermittent sharp and feels like it needs to pop Aggravating factors: letting his arm hang, raising his arm, and carrying  Relieving factors: holding his arm close to his body (resolves pain completely)  PRECAUTIONS: None  WEIGHT BEARING RESTRICTIONS: No  FALLS:  Has patient fallen in last 6 months? 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  PATIENT SURVEYS:  FOTO 57.20  COGNITION: Overall cognitive status: Within functional limits for tasks assessed     SENSATION: Patient reports no numbness or tingling  POSTURE: Forward head and rounded shoulders   UPPER EXTREMITY ROM:   Active ROM Right eval Left eval  Shoulder flexion  140 157  Shoulder extension    Shoulder abduction 134; pain at end range 151  Shoulder adduction    Shoulder internal rotation To T9; most painful "feels like my shoulder is going to pop" To T7  Shoulder external rotation To T4 To T5  Elbow flexion    Elbow extension    Wrist flexion    Wrist extension    Wrist ulnar deviation    Wrist radial deviation    Wrist pronation    Wrist supination    (Blank rows = not tested)  UPPER EXTREMITY MMT:  MMT Right eval Left eval  Shoulder flexion 4/5 4/5  Shoulder extension    Shoulder abduction 4/5 4/5  Shoulder adduction    Shoulder internal rotation 4+/5 5/5  Shoulder external rotation 4+/5 5/5  Middle trapezius    Lower trapezius    Elbow flexion 4+/5 4+/5  Elbow extension 4/5 4+/5  Wrist flexion    Wrist extension    Wrist ulnar deviation    Wrist radial deviation    Wrist pronation    Wrist supination    Grip strength (lbs)    (Blank rows = not tested)  SHOULDER SPECIAL TESTS: Rotator cuff assessment: Drop arm test: negative and Gerber lift off test: positive    JOINT MOBILITY TESTING:  R scapula: no limitation  PALPATION:  Palpation to short head of biceps reproduced his familiar symptoms   TODAY'S TREATMENT:  DATE:                                   11/26/22 EXERCISE LOG  Exercise Repetitions and Resistance Comments  UBE  10 minutes @ 60 RPM    Resisted IR  Green t-band x 25 reps    Resisted pull down  Green t-band x 25 reps   Resisted ER  Green t-band x 25 reps    Wall taps  Green t-band @ wrists x 1.5 minutes Limited by right shoulder fatigue  Wall slides 2 minutes   Self lateral distraction   "Felt good"   Therabar bending  Green t-bar x 25 reps each  Up and down ; "discomfort" when bending down   Functional IR stretch  4 x 30 seconds     Blank cell = exercise not performed  today  Modalities: no redness or adverse reaction to today's modalities  Date:  Unattended Estim: Shoulder, pre mod @ 80-150 Hz, 15 mins, Pain Vaso: Shoulder, 34 degrees; low pressure, 15 mins, Pain    11/24/22 EXERCISE LOG  Exercise Repetitions and Resistance Comments  UBE 90 RPM x8 min Forward/backward; anterior shoulder discomfort  Wall slides X15 reps Anterior shoulder discomfort/painful  Wall wash CW and CCW Anterior shoulder discomfort  Resisted row Red theraband x20 reps Popping around [facility] joint  Resisted extension Red theraband x20 reps   Resisted ER Red theraband x20 reps   Resisted IR Red theraband x20 reps   Resisted protraction Red theraband x20 reps   Shoulder flexion Standing 2# x20 reps Painful  Shoulder scaption Standing 2# x20 reps   Wall walks Red theraband x3 reps Popping       Blank cell = exercise not performed today   Modalities  Date:11/24/22 Unattended Estim: Shoulder, Pre-Mod, 15 mins, Pain Vaso: Shoulder, Low, 15 mins, Pain  PATIENT EDUCATION: Education details: POC, healing, prognosis, objective findings, and goals for therapy Person educated: Patient Education method: Explanation Education comprehension: verbalized understanding  HOME EXERCISE PROGRAM: Doorway stretch (3 x 30 seconds)   ASSESSMENT:  CLINICAL IMPRESSION: Patient was progressed with multiple new and familiar interventions for improved shoulder strength and stability. He experienced the most difficulty and fatigue with wall taps. However, he experienced a mild increase in discomfort with therabar bending, but this did not limit his ability to complete any of today's interventions. He reported that his shoulder felt alright upon the conclusion of treatment. He continues to require skilled physical therapy to address his remaining impairments to return to his prior level of function.   OBJECTIVE IMPAIRMENTS: decreased activity tolerance, decreased strength, impaired UE functional use,  and pain.   ACTIVITY LIMITATIONS: carrying, lifting, and reach over head  PARTICIPATION LIMITATIONS: occupation  PERSONAL FACTORS: Past/current experiences and 1 comorbidity: History of a bladder tumor   are also affecting patient's functional outcome.   REHAB POTENTIAL: Good  CLINICAL DECISION MAKING: Stable/uncomplicated  EVALUATION COMPLEXITY: Low  GOALS: Goals reviewed with patient? Yes  LONG TERM GOALS: Target date: 12/17/22  Patient will be independent with his HEP. Baseline:  Goal status: MET  2.  Patient will be able to complete his daily activities without his familiar shoulder pain exceeding 1/10. Baseline:  Goal status: On-going  3.  Patient will be able to lift and carry at least 25 pounds for improved function with his critical job demands. Baseline:  Goal status: On-going  4.  Patient will be  able to demonstrate at least 140 degrees of right shoulder abduction for improved function with overhead activity. Baseline:  Goal status: On-going  PLAN:  PT FREQUENCY: 1-2x/week  PT DURATION: 4 weeks  PLANNED INTERVENTIONS: Therapeutic exercises, Therapeutic activity, Neuromuscular re-education, Patient/Family education, Self Care, Joint mobilization, Dry Needling, Electrical stimulation, Cryotherapy, Moist heat, Taping, Vasopneumatic device, Manual therapy, and Re-evaluation  PLAN FOR NEXT SESSION: UBE, rotator cuff strengthening, and modalities as needed  Granville Lewis, PT 11/26/2022, 11:45 AM

## 2022-11-28 ENCOUNTER — Ambulatory Visit: Payer: BC Managed Care – PPO | Admitting: Urology

## 2022-12-03 IMAGING — CT CT ABD-PEL WO/W CM
3 of 8 series · 10 of 46 positions shown, 17 images · IV contrast (omnipaque)
Comparison: CT abdomen pelvis July 16, 2020

CLINICAL DATA: Gross hematuria times 6-8 months with clots, history
of nephrolithiasis

EXAM:
CT ABDOMEN AND PELVIS WITHOUT AND WITH CONTRAST
TECHNIQUE: Multidetector CT imaging of the abdomen and pelvis was performed
following the standard protocol before and following the bolus
administration of intravenous contrast.
CONTRAST:  100mL OMNIPAQUE IOHEXOL 300 MG/ML  SOLN

[Series 3: axial pre · axial · non-contrast · 0.78mm/px · z∈[+991,+1366]mm · 6 of 107 slices shown, 11 images]
[im 16/107  soft-tissue]
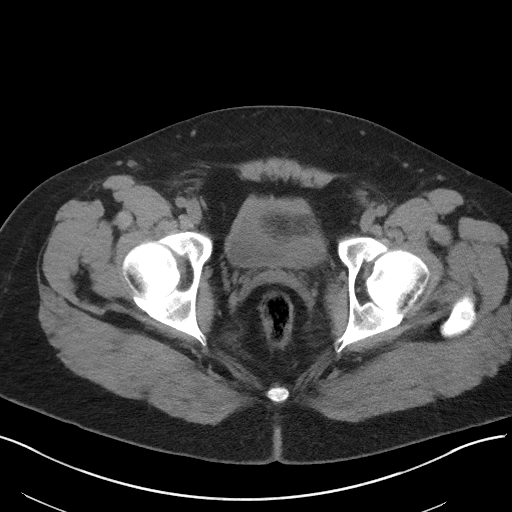
[im 16/107  bone]
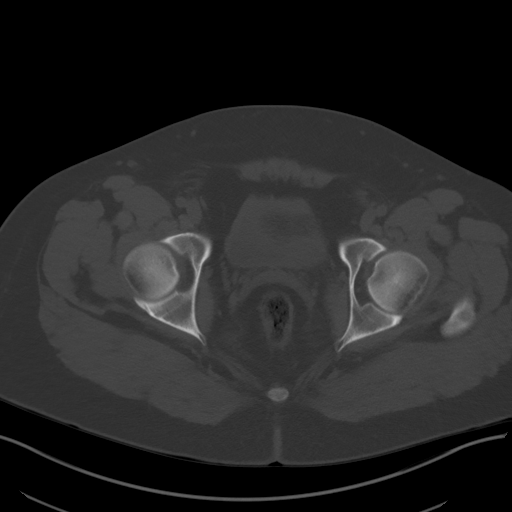
[im 31/107  soft-tissue]
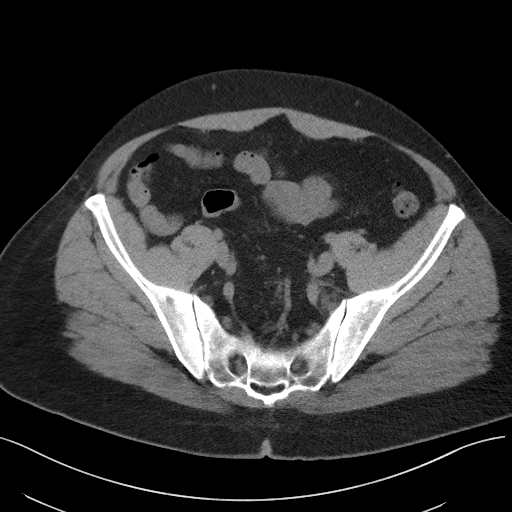
[im 46/107  soft-tissue]
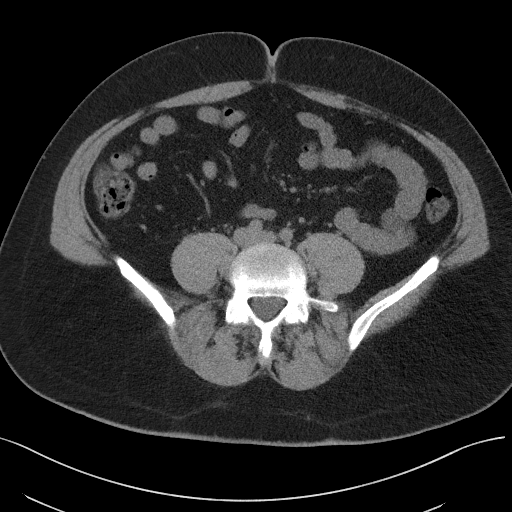
[im 46/107  lung]
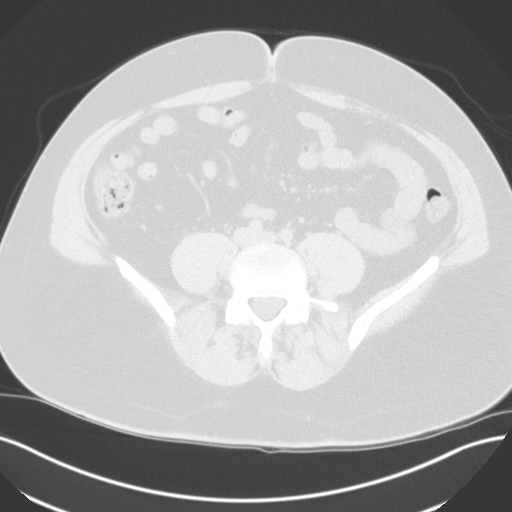
[im 61/107  soft-tissue]
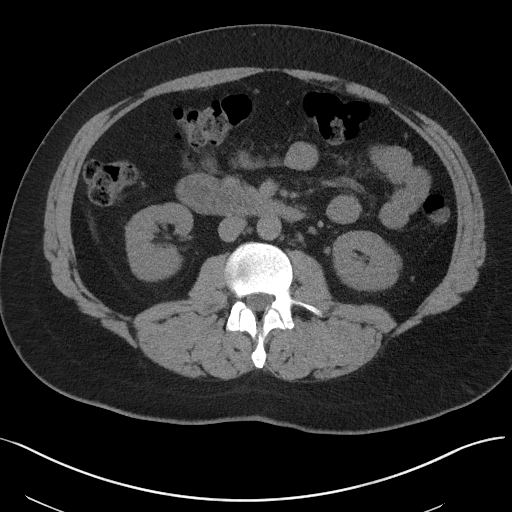
[im 61/107  lung]
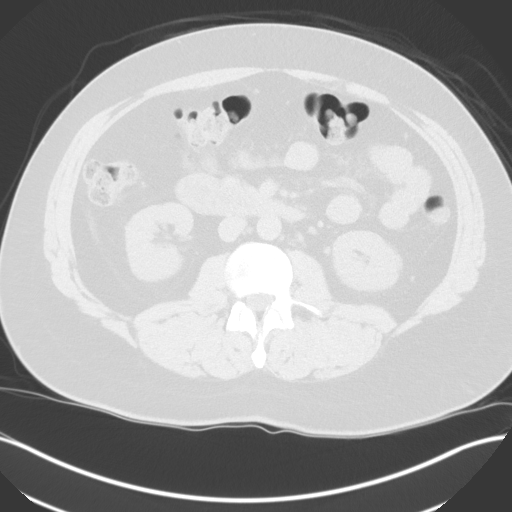
[im 76/107  soft-tissue]
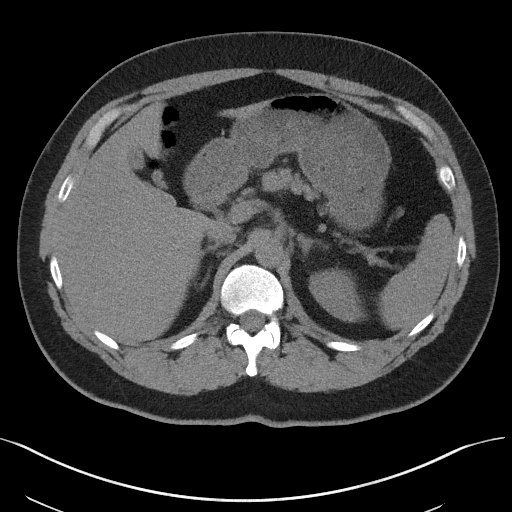
[im 76/107  lung]
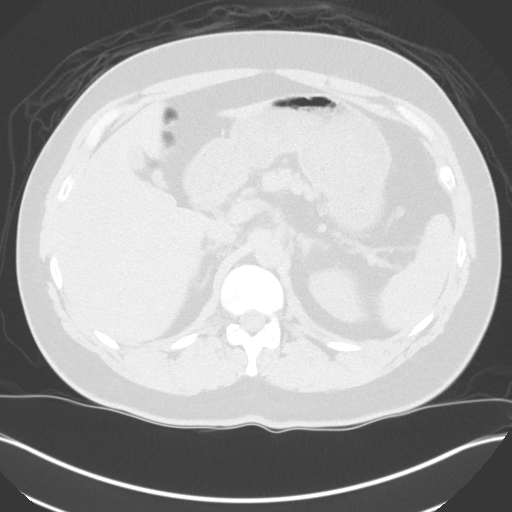
[im 91/107  soft-tissue]
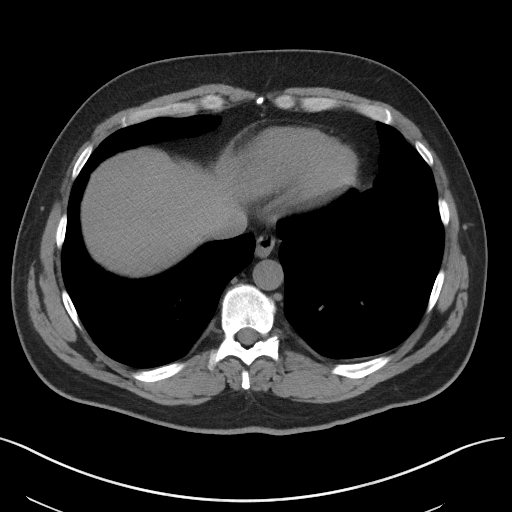
[im 91/107  lung]
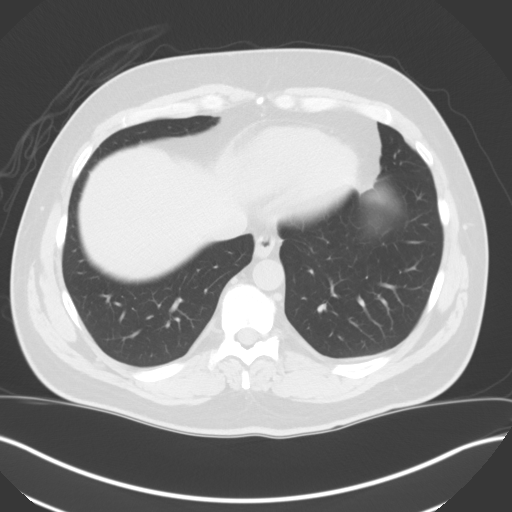

[Series 6: coronal pre · coronal · non-contrast · 0.91mm/px · 3 of 122 slices shown, 4 images]
[im 31/122  soft-tissue]
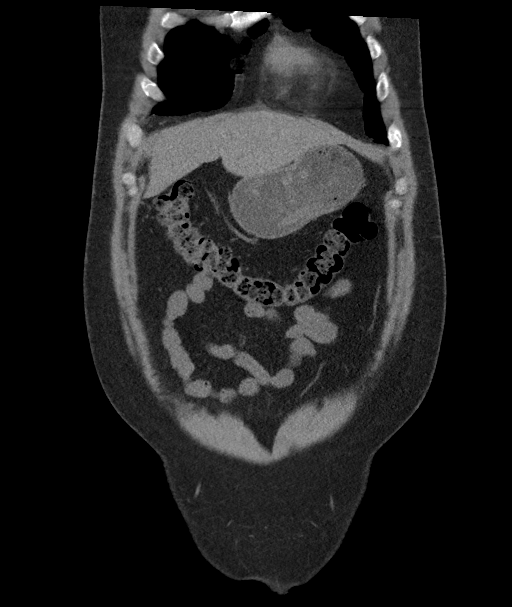
[im 61/122  soft-tissue]
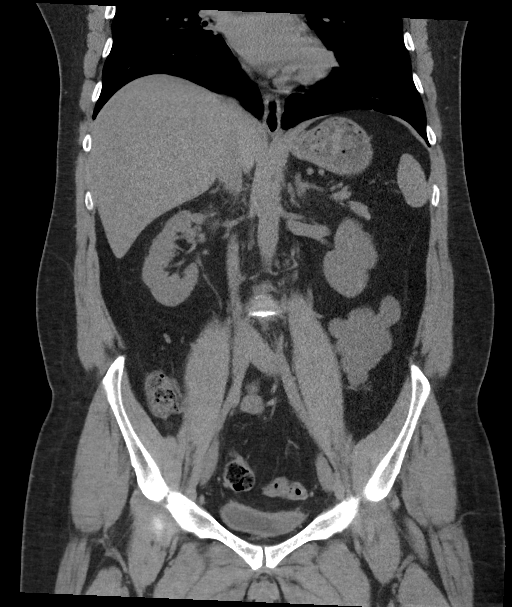
[im 61/122  bone]
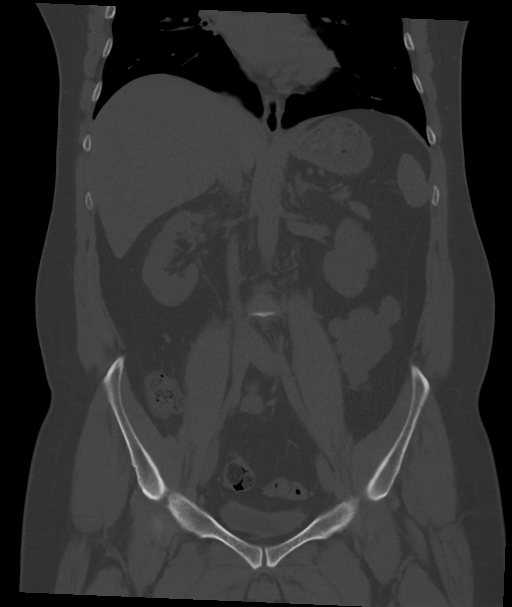
[im 91/122  soft-tissue]
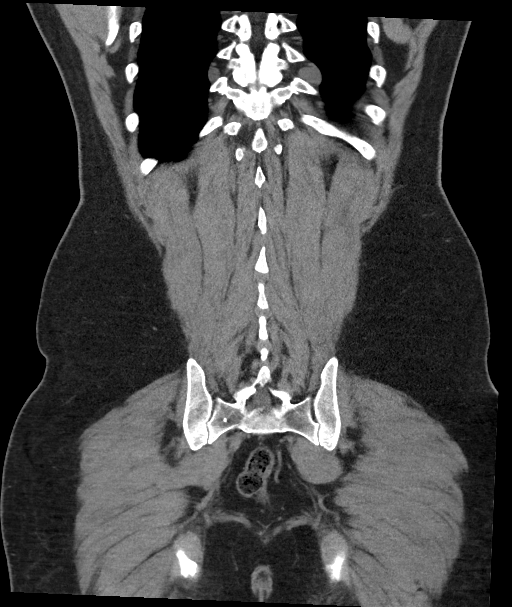

[Series 12: sagittal delay · sagittal · delayed · 0.70mm/px · 1 of 151 slices shown, 2 images]
[im 131/151  soft-tissue]
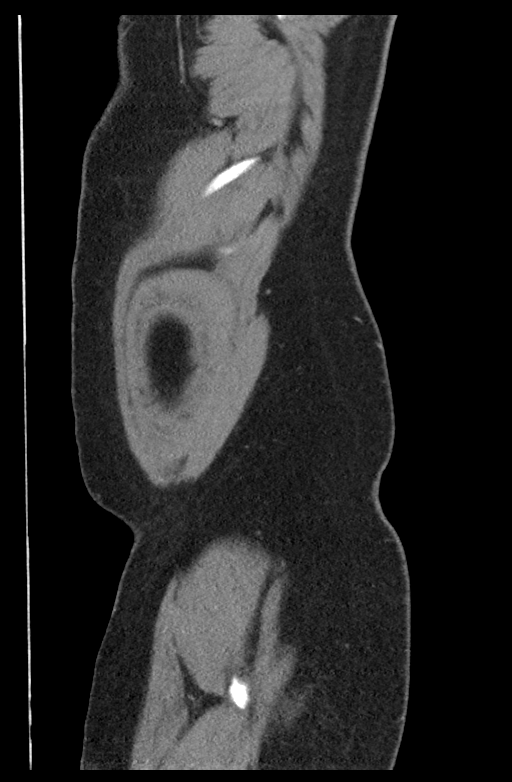
[im 131/151  bone]
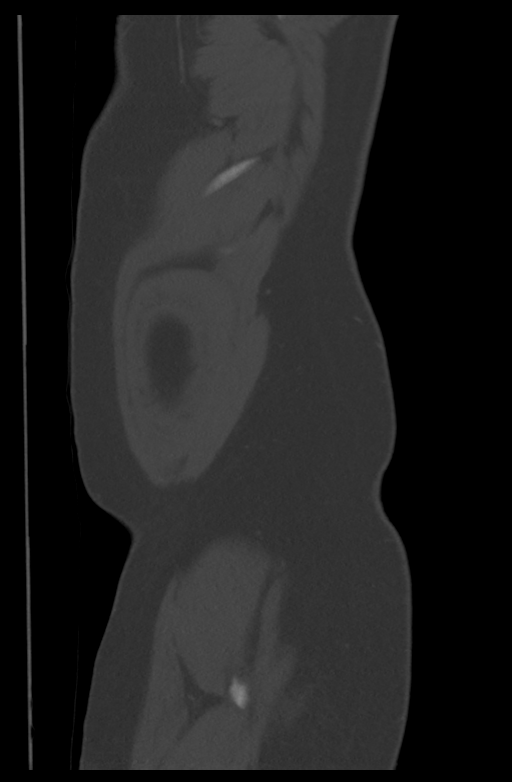

[10 of 46 positions shown; findings below may reference images not displayed]

FINDINGS: Lower chest: No acute abnormality. Normal size heart. No significant
pericardial effusion/thickening.

Hepatobiliary: No suspicious hepatic lesion. Gallbladder is
unremarkable. No biliary ductal dilation.

Pancreas: Within normal limits.

Spleen: Within normal limits.

Adrenals/Urinary Tract: Bilateral adrenal glands are unremarkable.

No hydronephrosis. Symmetric enhancement excretion of contrast with
bilateral kidneys. No suspicious filling defect visualized within
the opacified portions of the collecting system or ureters on
delayed imaging. However, the mid/distal right ureter is not
opacified limiting evaluation.

No renal, ureteral or bladder calculi visualized. No solid enhancing
renal masses.

In the posterior aspect of the left side of the urinary bladder
there is a rounded 2.3 x 2.2 cm heterogeneous intermediate density
lesion which does not demonstrate postcontrast enhancement on image
65/6 and 48/11.

Stomach/Bowel: Stomach is within normal limits. Appendix appears
normal. No evidence of bowel wall thickening, distention, or
inflammatory changes.

Vascular/Lymphatic: No significant vascular findings are present. No
pathologically enlarged abdominal or pelvic lymph nodes.

Reproductive: Prostate is unremarkable.

Other: No abdominopelvic ascites.

Musculoskeletal: No aggressive lytic or blastic lesions of bone. No
acute osseous abnormality.
IMPRESSION: 1. Heterogeneous nonenhancing 2.3 cm intermediate density lesion in
the posterior aspect of the left side of the urinary bladder,
favored to represent a fungal mycetoma of the urinary bladder or
less likely a bladder hematoma. Further evaluation with cystoscopy
is recommended.
2. No renal, ureteral, or bladder calculi visualized. No
hydronephrosis.
3. No solid enhancing renal masses.

## 2023-01-07 ENCOUNTER — Other Ambulatory Visit: Payer: Self-pay

## 2023-01-07 ENCOUNTER — Other Ambulatory Visit: Payer: Self-pay | Admitting: Urology

## 2023-01-07 NOTE — Progress Notes (Signed)
Opened in error

## 2023-01-07 NOTE — Telephone Encounter (Signed)
Please call in pain medication to [facility] in [place] before Cysto

## 2023-01-08 MED ORDER — DIAZEPAM 10 MG PO TABS: 10.0000 mg | ORAL_TABLET | Freq: Once | ORAL | 0 refills | Status: DC

## 2023-01-09 ENCOUNTER — Ambulatory Visit (INDEPENDENT_AMBULATORY_CARE_PROVIDER_SITE_OTHER): Payer: BC Managed Care – PPO | Admitting: Urology

## 2023-01-09 VITALS — BP 117/83 | HR 54

## 2023-01-09 DIAGNOSIS — C672 Malignant neoplasm of lateral wall of bladder: Secondary | ICD-10-CM

## 2023-01-09 DIAGNOSIS — Z8551 Personal history of malignant neoplasm of bladder: Secondary | ICD-10-CM | POA: Diagnosis not present

## 2023-01-09 MED ORDER — CIPROFLOXACIN HCL 500 MG PO TABS
500.0000 mg | ORAL_TABLET | Freq: Once | ORAL | Status: DC
Start: 2023-01-09 — End: 2023-05-28

## 2023-01-09 NOTE — Progress Notes (Signed)
   01/09/23  CC: followup bladder tumor  HPI: Ryan Conner is a 42yo here for followup for a bladder tumor Blood pressure 117/83, pulse (!) 54. NED. A&Ox3.   No respiratory distress   Abd soft, NT, ND Normal phallus with bilateral descended testicles  Cystoscopy Procedure Note  Patient identification was confirmed, informed consent was obtained, and patient was prepped using Betadine solution.  Lidocaine jelly was administered per urethral meatus.     Pre-Procedure: - Inspection reveals a normal caliber ureteral meatus.  Procedure: The flexible cystoscope was introduced without difficulty - No urethral strictures/lesions are present. - Normal prostate  - Normal bladder neck - Bilateral ureteral orifices identified - Bladder mucosa  reveals no ulcers, tumors, or lesions - No bladder stones - No trabeculation    Post-Procedure: - Patient tolerated the procedure well  Assessment/ Plan: Followup 1 yearfor cystoscopy  No follow-ups on file.  Ryan Aye, MD

## 2023-03-18 DIAGNOSIS — M722 Plantar fascial fibromatosis: Secondary | ICD-10-CM | POA: Diagnosis not present

## 2023-05-22 ENCOUNTER — Ambulatory Visit: Payer: Self-pay | Admitting: Family Medicine

## 2023-05-22 NOTE — Telephone Encounter (Signed)
 Reason for Disposition . [1] Caller has NON-URGENT medicine question about med that PCP prescribed AND [2] triager unable to answer question  Answer Assessment - Initial Assessment Questions 1. NAME of MEDICINE: What medicine(s) are you calling about?     metformin  2. QUESTION: What is your question? (e.g., double dose of medicine, side effect)     Can I increase my dose of metformin ? It is not suppressing my appetite like I thought it would 3. PRESCRIBER: Who prescribed the medicine? Reason: if prescribed by specialist, call should be referred to that group.     Dettinger 4. SYMPTOMS: Do you have any symptoms? If Yes, ask: What symptoms are you having?  How bad are the symptoms (e.g., mild, moderate, severe)     Still hungry all the time, increase in weight  Protocols used: Medication Question Call-A-AH

## 2023-05-22 NOTE — Telephone Encounter (Signed)
 If he is having issues with his blood sugars just go ahead and have him take it twice a day until he comes in and sees me.  If he does not have enough then just send him a new prescription for metformin  500 twice a day and then we will evaluate further when he returns.

## 2023-05-22 NOTE — Telephone Encounter (Signed)
 Copied from CRM [id number]. Topic: Clinical - Medication Question >> May 22, 2023  8:19 AM Curlee DEL wrote: Reason for CRM: Patient would like to discuss increasing the dosage of metFORMIN  - patient doesn't believe it's working as its suppose to.  05/22/23 0908- Called to discuss medication question, no answer, left vmail.

## 2023-05-22 NOTE — Telephone Encounter (Signed)
  Chief Complaint: Can I take more metformin , this is not suppressing my appetite at all? Symptoms: weight gain Frequency: since I saw dr dettinger lst Pertinent Negatives: Patient denies an s/s other than increased weight, increased appetite. Disposition: [] ED /[] Urgent Care (no appt availability in office) / [] Appointment(In office/virtual)/ []  [facility]/ [] Home Care/ [] Refused Recommended Disposition /[] [facility]/ []  Follow-up with PCP Additional Notes: Pt states that something needs to change he has attempted diet and exercise changes, nothing is helping.  Requested to be sched with PCP.

## 2023-05-26 NOTE — Telephone Encounter (Signed)
 Pt has an appt with Dettinger 1/16. He will discuss Metformin, Wegovy and referral to dietician at that time. Pt will hold off on increasing Metformin until seeing Dettinger.

## 2023-05-28 ENCOUNTER — Ambulatory Visit (INDEPENDENT_AMBULATORY_CARE_PROVIDER_SITE_OTHER): Payer: BC Managed Care – PPO | Admitting: Family Medicine

## 2023-05-28 VITALS — BP 118/78 | HR 66 | Ht 72.0 in | Wt 286.0 lb

## 2023-05-28 DIAGNOSIS — R635 Abnormal weight gain: Secondary | ICD-10-CM | POA: Diagnosis not present

## 2023-05-28 DIAGNOSIS — E785 Hyperlipidemia, unspecified: Secondary | ICD-10-CM

## 2023-05-28 DIAGNOSIS — R7303 Prediabetes: Secondary | ICD-10-CM | POA: Diagnosis not present

## 2023-05-28 LAB — BAYER DCA HB A1C WAIVED: HB A1C (BAYER DCA - WAIVED): 5.3 % (ref 4.8–5.6)

## 2023-05-28 MED ORDER — METFORMIN HCL 1000 MG PO TABS
1000.0000 mg | ORAL_TABLET | Freq: Two times a day (BID) | ORAL | 3 refills | Status: DC
Start: 2023-05-28 — End: 2023-09-18

## 2023-05-28 NOTE — Progress Notes (Signed)
BP 118/78   Pulse 66   Ht 6' (1.829 m)   Wt 286 lb (129.7 kg)   SpO2 96%   BMI 38.79 kg/m    Subjective:   Patient ID: Ryan Conner, male    DOB: 28-Jun-1980, 43 y.o.   MRN: [id number]  HPI: Ryan Conner is a 43 y.o. male presenting on 05/28/2023 for Weight Gain (Would like to discuss wt loss medications and possible referral to dietician) and Prediabetes (Increased metformin to BID)  Prediabetes/Weight Gain Patient is currently taking metformin. He has been taking metformin 500 mg once daily since July 2024, but he increased to metformin 500 mg twice daily yesterday. He reports no side effects, including nausea, vomiting, bloating, or diarrhea. He reports that this dose is not adequately suppressing his appetite as he continues to gain weight (6 pounds since July 2024). He would like to increase the metformin dose to help with appetite suppression until he is established with a weight loss clinic. He also feels like seeing a dietician would be helpful, so he can make some dietary changes to help with weight loss.  Hyperlipidemia Patient is currently taking rosuvastatin. He denies myalgias or weakness. He does not have a history of liver damage from it.   Relevant past medical, surgical, family and social history reviewed and updated as indicated. Interim medical history since our last visit reviewed. Allergies and medications reviewed and updated.  Review of Systems  Constitutional:  Negative for appetite change, chills and fever.  HENT:  Negative for congestion and sore throat.   Eyes:  Negative for visual disturbance.  Respiratory:  Negative for cough and shortness of breath.   Cardiovascular:  Negative for chest pain and leg swelling.  Gastrointestinal:  Negative for abdominal distention, abdominal pain, constipation and diarrhea.  Endocrine: Negative for polyuria.  Genitourinary:  Negative for difficulty urinating, dysuria and hematuria.  Musculoskeletal:  Negative for  myalgias.  Neurological:  Negative for weakness, light-headedness, numbness and headaches.    Per HPI unless specifically indicated above   Allergies as of 05/28/2023   No Known Allergies      Medication List        Accurate as of May 28, 2023 10:21 AM. If you have any questions, ask your nurse or doctor.          STOP taking these medications    predniSONE 20 MG tablet Commonly known as: DELTASONE Stopped by: Elige Radon Duvan Mousel       TAKE these medications    metFORMIN 1000 MG tablet Commonly known as: GLUCOPHAGE Take 1 tablet (1,000 mg total) by mouth 2 (two) times daily with a meal. What changed:  medication strength how much to take when to take this Another medication with the same name was removed. Continue taking this medication, and follow the directions you see here. Changed by: Elige Radon Zenora Karpel   rosuvastatin 5 MG tablet Commonly known as: Crestor Take 1 tablet (5 mg total) by mouth at bedtime.         Objective:   BP 118/78   Pulse 66   Ht 6' (1.829 m)   Wt 286 lb (129.7 kg)   SpO2 96%   BMI 38.79 kg/m   Wt Readings from Last 3 Encounters:  05/28/23 286 lb (129.7 kg)  11/12/22 280 lb (127 kg)  11/06/22 281 lb (127.5 kg)    Physical Exam Vitals and nursing note reviewed.  Constitutional:      Appearance: Normal appearance. He is obese.  HENT:     Head: Normocephalic and atraumatic.     Right Ear: External ear normal.     Left Ear: External ear normal.  Eyes:     Conjunctiva/sclera: Conjunctivae normal.  Cardiovascular:     Rate and Rhythm: Normal rate and regular rhythm.     Heart sounds: Normal heart sounds.  Pulmonary:     Effort: Pulmonary effort is normal.     Breath sounds: Normal breath sounds. No wheezing or rales.  Abdominal:     General: Abdomen is flat. There is no distension.     Palpations: Abdomen is soft.     Tenderness: There is no abdominal tenderness. There is no right CVA tenderness or left CVA  tenderness.  Musculoskeletal:     Cervical back: Normal range of motion and neck supple. No tenderness.     Right lower leg: No edema.     Left lower leg: No edema.  Skin:    General: Skin is warm and dry.  Neurological:     Mental Status: He is alert and oriented to person, place, and time.  Psychiatric:        Mood and Affect: Mood normal.        Behavior: Behavior normal.        Thought Content: Thought content normal.        Judgment: Judgment normal.    Assessment & Plan:   Problem List Items Addressed This Visit   None Visit Diagnoses       Prediabetes    -  Primary   Relevant Medications   metFORMIN (GLUCOPHAGE) 1000 MG tablet   Other Relevant Orders   Amb ref to Medical Nutrition Therapy-MNT   Bayer DCA Hb A1c Waived   CMP14+EGFR     Weight gain       Relevant Orders   Amb ref to Medical Nutrition Therapy-MNT     Hyperlipidemia, unspecified hyperlipidemia type       Relevant Orders   Lipid panel       Will increase metformin to 1000 mg twice daily. Recommended patient slowly increase to this dose by using his current prescription (metformin 500 mg BID x couple weeks followed by 500 mg TID x couple weeks and then 1000 mg BID). Will also send referral to healthy weight clinic and dietician. Discussed portion sizes, increasing exercise, and choosing healthy alternatives (e.g. reduced fat milk in coffee). Will also recheck labs today.  Follow up plan: Return if symptoms worsen or fail to improve.  Counseling provided for all of the vaccine components Orders Placed This Encounter  Procedures   Bayer DCA Hb A1c Waived   CMP14+EGFR   Lipid panel   Amb ref to Medical Nutrition Therapy-MNT    Gillermina Phy, Medical Student [facility] 05/28/2023, 10:21 AM  I was personally present for all components of the history, physical exam and/or medical decision making.  I agree with the documentation performed by the student and agree with  assessment and plan above. Arville Care, MD [facility] 06/08/2023, 9:27 AM

## 2023-05-29 LAB — LIPID PANEL
Chol/HDL Ratio: 5.8 {ratio} — ABNORMAL HIGH (ref 0.0–5.0)
Cholesterol, Total: 244 mg/dL — ABNORMAL HIGH (ref 100–199)
HDL: 42 mg/dL (ref >39)
LDL Chol Calc (NIH): 152 mg/dL — ABNORMAL HIGH (ref 0–99)
Triglycerides: 269 mg/dL — ABNORMAL HIGH (ref 0–149)
VLDL Cholesterol Cal: 50 mg/dL — ABNORMAL HIGH (ref 5–40)

## 2023-05-29 LAB — CMP14+EGFR
ALT: 32 [IU]/L (ref 0–44)
AST: 19 [IU]/L (ref 0–40)
Albumin: 4.6 g/dL (ref 4.1–5.1)
Alkaline Phosphatase: 111 [IU]/L (ref 44–121)
BUN/Creatinine Ratio: 22 — ABNORMAL HIGH (ref 9–20)
BUN: 18 mg/dL (ref 6–24)
Bilirubin Total: 0.4 mg/dL (ref 0.0–1.2)
CO2: 21 mmol/L (ref 20–29)
Calcium: 9.3 mg/dL (ref 8.7–10.2)
Chloride: 103 mmol/L (ref 96–106)
Creatinine, Ser: 0.83 mg/dL (ref 0.76–1.27)
Globulin, Total: 2.3 g/dL (ref 1.5–4.5)
Glucose: 91 mg/dL (ref 70–99)
Potassium: 4.3 mmol/L (ref 3.5–5.2)
Sodium: 140 mmol/L (ref 134–144)
Total Protein: 6.9 g/dL (ref 6.0–8.5)
eGFR: 112 mL/min/{1.73_m2} (ref >59)

## 2023-06-05 ENCOUNTER — Encounter: Payer: Self-pay | Admitting: Family Medicine

## 2023-08-12 ENCOUNTER — Encounter (INDEPENDENT_AMBULATORY_CARE_PROVIDER_SITE_OTHER): Payer: BC Managed Care – PPO | Admitting: Physician Assistant

## 2023-08-12 ENCOUNTER — Encounter (INDEPENDENT_AMBULATORY_CARE_PROVIDER_SITE_OTHER): Payer: Self-pay

## 2023-08-13 ENCOUNTER — Telehealth: Payer: Self-pay | Admitting: Family Medicine

## 2023-08-13 NOTE — Telephone Encounter (Signed)
 Returned call NA. Pt will need an apt for lab as long as lab orders are on a [employer] order form.  Copied from CRM [id number]. Topic: Clinical - Request for Lab/Test Order >> Aug 13, 2023  8:19 AM Clayton Bibles wrote: Reason for CRM:  He has lab order from [facility] that is part of his insurance. He needs to complete labs for A1C, CMP, TSH, Lipid Panel. I tried to call the office and could not get through. Can he come in to office lab to complete this order? Please call him at [phone]. Thanks

## 2023-08-17 ENCOUNTER — Encounter: Payer: Self-pay | Admitting: Nurse Practitioner

## 2023-08-17 ENCOUNTER — Encounter (INDEPENDENT_AMBULATORY_CARE_PROVIDER_SITE_OTHER): Payer: Self-pay

## 2023-08-17 ENCOUNTER — Ambulatory Visit (INDEPENDENT_AMBULATORY_CARE_PROVIDER_SITE_OTHER): Admitting: Nurse Practitioner

## 2023-08-17 VITALS — BP 126/81 | HR 97 | Temp 97.3°F | Ht 72.0 in | Wt 282.0 lb

## 2023-08-17 DIAGNOSIS — R635 Abnormal weight gain: Secondary | ICD-10-CM | POA: Diagnosis not present

## 2023-08-17 DIAGNOSIS — R2231 Localized swelling, mass and lump, right upper limb: Secondary | ICD-10-CM | POA: Diagnosis not present

## 2023-08-17 DIAGNOSIS — Z6838 Body mass index (BMI) 38.0-38.9, adult: Secondary | ICD-10-CM

## 2023-08-17 NOTE — Progress Notes (Addendum)
   Subjective:    Patient ID: Ryan Conner, male    DOB: Feb 20, 1981, 43 y.o.   MRN: [id number]   Chief Complaint: Knot in arm pit   HPI  Patient come sin c/o knot in right axilla. Noticed it about 8 days ago. Was sore at first but is no longer tender. No change in size.   Also concerned about weight. Wants to make sure there is nothing wrong with thyroid. Patient Active Problem List   Diagnosis Date Noted   Malignant neoplasm of lateral wall of urinary bladder (HCC) 10/22/2020   Status post placement of implantable loop recorder 09/25/2020   Occipital neuralgia of right side 07/04/2020   Chronic tension-type headache, not intractable 06/11/2020       Review of Systems  Constitutional:  Negative for diaphoresis.  Eyes:  Negative for pain.  Respiratory:  Negative for shortness of breath.   Cardiovascular:  Negative for chest pain, palpitations and leg swelling.  Gastrointestinal:  Negative for abdominal pain.  Endocrine: Negative for polydipsia.  Skin:  Negative for rash.  Neurological:  Negative for dizziness, weakness and headaches.  Hematological:  Does not bruise/bleed easily.  All other systems reviewed and are negative.      Objective:   Physical Exam Constitutional:      Appearance: Normal appearance. He is obese.  Cardiovascular:     Rate and Rhythm: Normal rate and regular rhythm.     Heart sounds: Normal heart sounds.  Pulmonary:     Effort: Pulmonary effort is normal.     Breath sounds: Normal breath sounds.  Skin:    General: Skin is warm.     Comments: Right non tender axillary nodule about the size of a grape No erythema or edema  Neurological:     General: No focal deficit present.     Mental Status: He is alert and oriented to person, place, and time.  Psychiatric:        Mood and Affect: Mood normal.        Behavior: Behavior normal.     BP 126/81   Pulse 97   Temp (!) 97.3 F (36.3 C) (Temporal)   Ht 6' (1.829 m)   Wt 282 lb (127.9 kg)    BMI 38.25 kg/m        Assessment & Plan:   Ryan Conner in today with chief complaint of Knot in arm pit   1. Mass of right axilla (Primary) Keep watch on area until has U/S Will contact patient once results ar available. - [place] AXILLA RIGHT; Future  2. BMI 38.0-38.9 TSH pending  The above assessment and management plan was discussed with the patient. The patient verbalized understanding of and has agreed to the management plan. Patient is aware to call the clinic if symptoms persist or worsen. Patient is aware when to return to the clinic for a follow-up visit. Patient educated on when it is appropriate to go to the emergency department.   Ryan Daphine Deutscher, FNP

## 2023-08-17 NOTE — Addendum Note (Signed)
 Addended by: Bennie Pierini on: 08/17/2023 10:12 AM   Modules accepted: Orders

## 2023-08-18 LAB — THYROID PANEL WITH TSH
Free Thyroxine Index: 1.8 (ref 1.2–4.9)
T3 Uptake Ratio: 30 % (ref 24–39)
T4, Total: 6.1 ug/dL (ref 4.5–12.0)
TSH: 1.12 u[IU]/mL (ref 0.450–4.500)

## 2023-08-26 ENCOUNTER — Telehealth: Payer: Self-pay | Admitting: Family Medicine

## 2023-08-26 NOTE — Telephone Encounter (Signed)
 No answer and no vmail. Pt is scheduled for u/s 4/21 at 10:30am. He needs to arrive 81m early. Scan is at [facility].  Sent pt mychart message as well.

## 2023-08-26 NOTE — Telephone Encounter (Signed)
 Copied from CRM [id number]. Topic: Referral - Question >> Aug 26, 2023  2:09 PM DeAngela L wrote: Reason for CRM: Nap[facility] Backbone [phone] calling about referral for Patients Ultrasound for a mass in the arm pit area   [facility] Fax [phone]

## 2023-08-31 ENCOUNTER — Ambulatory Visit (HOSPITAL_COMMUNITY)
Admission: RE | Admit: 2023-08-31 | Discharge: 2023-08-31 | Disposition: A | Discharge status: Home / Self Care | Source: Ambulatory Visit | Attending: Nurse Practitioner | Admitting: Nurse Practitioner

## 2023-08-31 DIAGNOSIS — R2231 Localized swelling, mass and lump, right upper limb: Secondary | ICD-10-CM | POA: Insufficient documentation

## 2023-08-31 DIAGNOSIS — L02411 Cutaneous abscess of right axilla: Secondary | ICD-10-CM | POA: Diagnosis not present

## 2023-09-03 ENCOUNTER — Ambulatory Visit (HOSPITAL_COMMUNITY)

## 2023-09-18 ENCOUNTER — Ambulatory Visit (INDEPENDENT_AMBULATORY_CARE_PROVIDER_SITE_OTHER): Payer: BC Managed Care – PPO | Admitting: Family Medicine

## 2023-09-18 ENCOUNTER — Encounter: Payer: Self-pay | Admitting: Family Medicine

## 2023-09-18 VITALS — BP 123/85 | HR 65 | Temp 97.4°F | Ht 72.0 in | Wt 284.0 lb

## 2023-09-18 DIAGNOSIS — E785 Hyperlipidemia, unspecified: Secondary | ICD-10-CM | POA: Insufficient documentation

## 2023-09-18 DIAGNOSIS — R7303 Prediabetes: Secondary | ICD-10-CM | POA: Insufficient documentation

## 2023-09-18 LAB — CBC WITH DIFFERENTIAL/PLATELET
Basophils Absolute: 0 10*3/uL (ref 0.0–0.2)
Basos: 1 %
EOS (ABSOLUTE): 0.1 10*3/uL (ref 0.0–0.4)
Eos: 2 %
Hematocrit: 45 % (ref 37.5–51.0)
Hemoglobin: 14.5 g/dL (ref 13.0–17.7)
Immature Grans (Abs): 0 10*3/uL (ref 0.0–0.1)
Immature Granulocytes: 0 %
Lymphocytes Absolute: 2.2 10*3/uL (ref 0.7–3.1)
Lymphs: 40 %
MCH: 27.7 pg (ref 26.6–33.0)
MCHC: 32.2 g/dL (ref 31.5–35.7)
MCV: 86 fL (ref 79–97)
Monocytes Absolute: 0.6 10*3/uL (ref 0.1–0.9)
Monocytes: 11 %
Neutrophils Absolute: 2.5 10*3/uL (ref 1.4–7.0)
Neutrophils: 46 %
Platelets: 245 10*3/uL (ref 150–450)
RBC: 5.23 x10E6/uL (ref 4.14–5.80)
RDW: 13.1 % (ref 11.6–15.4)
WBC: 5.4 10*3/uL (ref 3.4–10.8)

## 2023-09-18 LAB — CMP14+EGFR
ALT: 31 IU/L (ref 0–44)
AST: 17 IU/L (ref 0–40)
Albumin: 4.4 g/dL (ref 4.1–5.1)
Alkaline Phosphatase: 104 IU/L (ref 44–121)
BUN/Creatinine Ratio: 27 — ABNORMAL HIGH (ref 9–20)
BUN: 23 mg/dL (ref 6–24)
Bilirubin Total: 0.2 mg/dL (ref 0.0–1.2)
CO2: 20 mmol/L (ref 20–29)
Calcium: 9 mg/dL (ref 8.7–10.2)
Chloride: 102 mmol/L (ref 96–106)
Creatinine, Ser: 0.84 mg/dL (ref 0.76–1.27)
Globulin, Total: 2.3 g/dL (ref 1.5–4.5)
Glucose: 98 mg/dL (ref 70–99)
Potassium: 4.7 mmol/L (ref 3.5–5.2)
Sodium: 138 mmol/L (ref 134–144)
Total Protein: 6.7 g/dL (ref 6.0–8.5)
eGFR: 112 mL/min/{1.73_m2} (ref >59)

## 2023-09-18 LAB — LIPID PANEL
Chol/HDL Ratio: 3.9 ratio (ref 0.0–5.0)
Cholesterol, Total: 173 mg/dL (ref 100–199)
HDL: 44 mg/dL (ref >39)
LDL Chol Calc (NIH): 92 mg/dL (ref 0–99)
Triglycerides: 220 mg/dL — ABNORMAL HIGH (ref 0–149)
VLDL Cholesterol Cal: 37 mg/dL (ref 5–40)

## 2023-09-18 LAB — BAYER DCA HB A1C WAIVED: HB A1C (BAYER DCA - WAIVED): 5.4 % (ref 4.8–5.6)

## 2023-09-18 MED ORDER — ROSUVASTATIN CALCIUM 5 MG PO TABS
5.0000 mg | ORAL_TABLET | Freq: Every evening | ORAL | 3 refills | Status: AC
Start: 2023-09-18 — End: ?

## 2023-09-18 MED ORDER — METFORMIN HCL 1000 MG PO TABS: 1000.0000 mg | ORAL_TABLET | Freq: Two times a day (BID) | ORAL | 3 refills | Status: AC

## 2023-09-18 NOTE — Progress Notes (Signed)
 BP 123/85   Pulse 65   Temp (!) 97.4 F (36.3 C)   Ht 6' (1.829 m)   Wt 284 lb (128.8 kg)   SpO2 97%   BMI 38.52 kg/m    Subjective:   Patient ID: Ryan Conner, male    DOB: Feb 01, 1981, 43 y.o.   MRN: [id number]  HPI: Ryan Conner is a 43 y.o. male presenting on 09/18/2023 for No chief complaint on file.   HPI Prediabetes Patient comes in today for recheck of his diabetes. Patient has been currently taking metformin , also working with a program at [facility]. Patient is not currently on an ACE inhibitor/ARB. Patient has not seen an ophthalmologist this year. Patient denies any new issues with their feet. The symptom started onset as an adult hyperlipidemia ARE RELATED TO DM   Hyperlipidemia Patient is coming in for recheck of his hyperlipidemia. The patient is currently taking Crestor . They deny any issues with myalgias or history of liver damage from it. They deny any focal numbness or weakness or chest pain.   Relevant past medical, surgical, family and social history reviewed and updated as indicated. Interim medical history since our last visit reviewed. Allergies and medications reviewed and updated.  Review of Systems  Constitutional:  Negative for chills and fever.  Eyes:  Negative for visual disturbance.  Respiratory:  Negative for shortness of breath and wheezing.   Cardiovascular:  Negative for chest pain and leg swelling.  Musculoskeletal:  Negative for back pain and gait problem.  Skin:  Negative for rash.  Neurological:  Negative for dizziness.  All other systems reviewed and are negative.   Per HPI unless specifically indicated above   Allergies as of 09/18/2023   No Known Allergies      Medication List        Accurate as of Sep 18, 2023  8:33 AM. If you have any questions, ask your nurse or doctor.          metFORMIN  1000 MG tablet Commonly known as: GLUCOPHAGE  Take 1 tablet (1,000 mg total) by mouth 2 (two) times daily with a meal.    rosuvastatin  5 MG tablet Commonly known as: Crestor  Take 1 tablet (5 mg total) by mouth at bedtime.         Objective:   BP 123/85   Pulse 65   Temp (!) 97.4 F (36.3 C)   Ht 6' (1.829 m)   Wt 284 lb (128.8 kg)   SpO2 97%   BMI 38.52 kg/m   Wt Readings from Last 3 Encounters:  09/18/23 284 lb (128.8 kg)  08/17/23 282 lb (127.9 kg)  05/28/23 286 lb (129.7 kg)    Physical Exam Vitals and nursing note reviewed.  Constitutional:      General: He is not in acute distress.    Appearance: He is well-developed. He is not diaphoretic.  Eyes:     General: No scleral icterus.    Conjunctiva/sclera: Conjunctivae normal.  Neck:     Thyroid : No thyromegaly.  Cardiovascular:     Rate and Rhythm: Normal rate and regular rhythm.     Heart sounds: Normal heart sounds. No murmur heard. Pulmonary:     Effort: Pulmonary effort is normal. No respiratory distress.     Breath sounds: Normal breath sounds. No wheezing.  Musculoskeletal:        General: No swelling. Normal range of motion.     Cervical back: Neck supple.  Lymphadenopathy:     Cervical: No  cervical adenopathy.  Skin:    General: Skin is warm and dry.     Findings: No rash.  Neurological:     Mental Status: He is alert and oriented to person, place, and time.     Coordination: Coordination normal.  Psychiatric:        Behavior: Behavior normal.       Assessment & Plan:   Problem List Items Addressed This Visit       Other   Hyperlipidemia   Relevant Medications   rosuvastatin  (CRESTOR ) 5 MG tablet   Other Relevant Orders   CBC with Differential/Platelet   CMP14+EGFR   Lipid panel   Prediabetes - Primary   Relevant Medications   metFORMIN  (GLUCOPHAGE ) 1000 MG tablet   Other Relevant Orders   Bayer DCA Hb A1c Waived   CBC with Differential/Platelet   CMP14+EGFR  Will check blood work today, will check A1c, continue to work with his weight loss program, seems to be doing well.  Follow up  plan: Return in about 6 months (around 03/20/2024), or if symptoms worsen or fail to improve, for Prediabetes and hyperlipidemia.  Counseling provided for all of the vaccine components Orders Placed This Encounter  Procedures   Bayer DCA Hb A1c Waived   CBC with Differential/Platelet   CMP14+EGFR   Lipid panel    Jolyne Needs, MD [facility] 09/18/2023, 8:33 AM

## 2023-09-23 ENCOUNTER — Ambulatory Visit: Payer: Self-pay | Admitting: Family Medicine

## 2023-12-06 ENCOUNTER — Telehealth: Admitting: Physician Assistant

## 2023-12-06 DIAGNOSIS — L237 Allergic contact dermatitis due to plants, except food: Secondary | ICD-10-CM | POA: Diagnosis not present

## 2023-12-06 MED ORDER — MUPIROCIN 2 % EX OINT
1.0000 | TOPICAL_OINTMENT | Freq: Two times a day (BID) | CUTANEOUS | 0 refills | Status: AC
Start: 2023-12-06 — End: ?

## 2023-12-06 MED ORDER — PREDNISONE 10 MG PO TABS
ORAL_TABLET | ORAL | 0 refills | Status: AC
Start: 2023-12-06 — End: 2023-12-20

## 2023-12-06 MED ORDER — TRIAMCINOLONE ACETONIDE 0.1 % EX CREA: 1.0000 | TOPICAL_CREAM | Freq: Two times a day (BID) | CUTANEOUS | 0 refills | Status: AC

## 2023-12-06 NOTE — Progress Notes (Signed)
 Virtual Visit Consent   [name], you are scheduled for a virtual visit with a [facility] provider today. Just as with appointments in the office, your consent must be obtained to participate. Your consent will be active for this visit and any virtual visit you may have with one of our providers in the next 365 days. If you have a MyChart account, a copy of this consent can be sent to you electronically.  As this is a virtual visit, video technology does not allow for your provider to perform a traditional examination. This may limit your provider's ability to fully assess your condition. If your provider identifies any concerns that need to be evaluated in person or the need to arrange testing (such as labs, EKG, etc.), we will make arrangements to do so. Although advances in technology are sophisticated, we cannot ensure that it will always work on either your end or our end. If the connection with a video visit is poor, the visit may have to be switched to a telephone visit. With either a video or telephone visit, we are not always able to ensure that we have a secure connection.  By engaging in this virtual visit, you consent to the provision of healthcare and authorize for your insurance to be billed (if applicable) for the services provided during this visit. Depending on your insurance coverage, you may receive a charge related to this service.  I need to obtain your verbal consent now. Are you willing to proceed with your visit today? Ryan Conner has provided verbal consent on 12/06/2023 for a virtual visit (video or telephone). Ryan Conner, [place]  Date: 12/06/2023 11:32 AM   Virtual Visit via Video Note   I, Ryan Conner, connected with  Ryan Conner  ([id number], 10-05-1980) on 12/06/23 at 11:15 AM EDT by a video-enabled telemedicine application and verified that I am speaking with the correct person using two identifiers.  Location: Patient: Virtual Visit Location  Patient: Home Provider: Virtual Visit Location Provider: Home Office   I discussed the limitations of evaluation and management by telemedicine and the availability of in person appointments. The patient expressed understanding and agreed to proceed.    History of Present Illness: Ryan Conner is a 43 y.o. who identifies as a male who was assigned male at birth, and is being seen today for a pruritic rash first noted yesterday morning.  Notes he had to go through some wooded areas the night before.  Notes the rashes quickly spread from being on his upper extremities to now on both of his lower extremities as well as his neck and face.  Notes some mild tenderness at site of the rash on his left wrist.  Denies fever or chills.  OTC -- Ivarest, Benadryl cream -- short-lived relief  HPI: HPI  Problems:  Patient Active Problem List   Diagnosis Date Noted   Hyperlipidemia 09/18/2023   Prediabetes 09/18/2023   Malignant neoplasm of lateral wall of urinary bladder (HCC) 10/22/2020   Status post placement of implantable loop recorder 09/25/2020   Occipital neuralgia of right side 07/04/2020   Chronic tension-type headache, not intractable 06/11/2020    Allergies: No Known Allergies Medications:  Current Outpatient Medications:    mupirocin  ointment (BACTROBAN ) 2 %, Apply 1 Application topically 2 (two) times daily., Disp: 22 g, Rfl: 0   predniSONE  (DELTASONE ) 10 MG tablet, Take 4 tablets (40 mg total) by mouth daily with breakfast for 4 days, THEN 3 tablets (30 mg total)  daily with breakfast for 4 days, THEN 2 tablets (20 mg total) daily with breakfast for 3 days, THEN 1 tablet (10 mg total) daily with breakfast for 3 days., Disp: 37 tablet, Rfl: 0   triamcinolone  cream (KENALOG ) 0.1 %, Apply 1 Application topically 2 (two) times daily., Disp: 30 g, Rfl: 0   metFORMIN  (GLUCOPHAGE ) 1000 MG tablet, Take 1 tablet (1,000 mg total) by mouth 2 (two) times daily with a meal., Disp: 180 tablet, Rfl: 3    rosuvastatin  (CRESTOR ) 5 MG tablet, Take 1 tablet (5 mg total) by mouth at bedtime., Disp: 90 tablet, Rfl: 3  Observations/Objective: Patient is well-developed, well-nourished in no acute distress.  Resting comfortably at home.  Head is normocephalic, atraumatic.  No labored breathing. Speech is clear and coherent with logical content.  Patient is alert and oriented at baseline.  Erythematous papulovesicular rash scattered on different areas of his body.  The areas noted on his arms are linear in nature, some with areas of confluence.  Area of rash on left wrist with some mild surrounding erythema.  Assessment and Plan: 1. Poison ivy dermatitis (Primary) - mupirocin  ointment (BACTROBAN ) 2 %; Apply 1 Application topically 2 (two) times daily.  Dispense: 22 g; Refill: 0 - triamcinolone  cream (KENALOG ) 0.1 %; Apply 1 Application topically 2 (two) times daily.  Dispense: 30 g; Refill: 0 - predniSONE  (DELTASONE ) 10 MG tablet; Take 4 tablets (40 mg total) by mouth daily with breakfast for 4 days, THEN 3 tablets (30 mg total) daily with breakfast for 4 days, THEN 2 tablets (20 mg total) daily with breakfast for 3 days, THEN 1 tablet (10 mg total) daily with breakfast for 3 days.  Dispense: 37 tablet; Refill: 0  Suspected poison ivy/oak dermatitis.  Supportive measures and OTC medications reviewed.  Question potential start of a mild cellulitis of the left wrist but no active drainage, just mild tenderness and erythema.  Will start mupirocin  ointment topically for this, but he is to let us  know of any new or worsening symptoms, where we want to initiate oral antibiotics.  For the dermatitis itself, will start topical triamcinolone  to areas other than the face, axillary region or groin.  Prednisone  taper per orders --he does have a history of diabetes, on metformin  the last A1c less than 6.  Strict in person evaluation precautions reviewed.  Follow Up Instructions: I discussed the assessment and treatment  plan with the patient. The patient was provided an opportunity to ask questions and all were answered. The patient agreed with the plan and demonstrated an understanding of the instructions.  A copy of instructions were sent to the patient via MyChart unless otherwise noted below.   The patient was advised to call back or seek an in-person evaluation if the symptoms worsen or if the condition fails to improve as anticipated.    Ryan Velma Lunger, PA-C

## 2023-12-06 NOTE — Patient Instructions (Signed)
  Ryan Conner, thank you for joining Ryan Velma Lunger, PA-C for today's virtual visit.  While this provider is not your primary care provider (PCP), if your PCP is located in our provider database this encounter information will be shared with them immediately following your visit.   A [facility] MyChart account gives you access to today's visit and all your visits, tests, and labs performed at [facility]  click here if you don't have a [facility] MyChart account or go to [email or web address]  Consent: (Patient) Ryan Conner provided verbal consent for this virtual visit at the beginning of the encounter.  Current Medications:  Current Outpatient Medications:    metFORMIN  (GLUCOPHAGE ) 1000 MG tablet, Take 1 tablet (1,000 mg total) by mouth 2 (two) times daily with a meal., Disp: 180 tablet, Rfl: 3   rosuvastatin  (CRESTOR ) 5 MG tablet, Take 1 tablet (5 mg total) by mouth at bedtime., Disp: 90 tablet, Rfl: 3   Medications ordered in this encounter:  No orders of the defined types were placed in this encounter.    *If you need refills on other medications prior to your next appointment, please contact your pharmacy*  Follow-Up: Call back or seek an in-person evaluation if the symptoms worsen or if the condition fails to improve as anticipated.  [facility] [phone]  Other Instructions Please continue to keep the skin clean and dry. You can apply the topical triamcinolone  cream to the rash, excluding any use on the face, arm region or groin. Take the steroid taper as directed, making sure to minimize your carbs and staying well-hydrated. You can apply the mupirocin  ointment as directed to the area on your left wrist.  If that is not quickly calming down, or you notice any increased pain, tenderness or spreading redness, please let your primary care provider know soon as possible.   If you have been instructed to have an in-person evaluation  today at a local Urgent Care facility, please use the link below. It will take you to a list of all of our available [facility], including address, phone number and hours of operation. Please do not delay care.  [facility]  If you or a family member do not have a primary care provider, use the link below to schedule a visit and establish care. When you choose a [facility] primary care physician or advanced practice provider, you gain a long-term partner in health. Find a Primary Care Provider  Learn more about [facility]'s in-office and virtual care options:  - Get Care Now

## 2023-12-07 ENCOUNTER — Ambulatory Visit: Payer: Self-pay

## 2023-12-07 NOTE — Telephone Encounter (Signed)
 FYI Only or Action Required?: FYI only for provider.  Patient was last seen in primary care on 09/18/2023 by Dettinger, Fonda LABOR, MD.  Called Nurse Triage reporting Rash.  Symptoms began several days ago.  Interventions attempted: Prescription medications: topical creams, oral steroids with minimal relief.  Symptoms are: gradually worsening.  Triage Disposition: See Physician Within 24 Hours  Patient/caregiver understands and will follow disposition?: Yes     Copied from CRM [id number]. Topic: Clinical - Red Word Triage >> Dec 07, 2023 12:40 PM Zebedee SAUNDERS wrote: Red Word that prompted transfer to Nurse Triage: Pt had telehealth yesterday for rash but is spreading and now has pus. Reason for Disposition  [1] Looks infected (e.g., soft yellow scabs, pus or spreading redness) AND [2] no fever  SEVERE itching (i.e., interferes with sleep, normal activities or school)  Additional Information  Commented on: Answer Assessment    Pt recently had telehealth visit yesterday and dx with poison ivy. Pt reports rash is worsening and itching, notably when hot/sweating. Pt reports is taking all Rx as prescribed with minimal relief.  Scheduled patient on the next available acute appt on December 09, 2023 with [facility] .  Answer Assessment - Initial Assessment Questions 1. APPEARANCE of RASH: What does the rash look like? (e.g., blisters, dry flaky skin, red spots, redness, sores)     Like a blister... a few are bleeding, a few have yellow pus Pt reports using topical Rx as prescribed with minimal results 2. SIZE: How big are the spots? (e.g., tip of pen, eraser, coin; inches, centimeters)     A few that are fist sizes, others that are dollar quarter sized 3. LOCATION: Where is the rash located?     Generalized to bilateral arms and legs, and around mouth 4. COLOR: What color is the rash? (Note: It is difficult to assess rash color in people with darker-colored skin. When this situation  occurs, simply ask the caller to describe what they see.)     red 5. ONSET: When did the rash begin?     Saturday Morning 6. FEVER: Do you have a fever? If Yes, ask: What is your temperature, how was it measured, and when did it start?     denies 7. ITCHING: Does the rash itch? If Yes, ask: How bad is the itch? (Scale 1-10; or mild, moderate, severe)     Mild-mod 8. CAUSE: What do you think is causing the rash?     Telehealth - dx with poison ivy  9. MEDICINE FACTORS: Have you started any new medicines within the last 2 weeks? (e.g., antibiotics)      Wegovy - 1 month ago 10. OTHER SYMPTOMS: Do you have any other symptoms? (e.g., dizziness, headache, sore throat, joint pain)       denies 11. PREGNANCY: Is there any chance you are pregnant? When was your last menstrual period?       N/a  Protocols used: Rash or Redness - Widespread-A-AH, Poison Ivy - [place] - [facility] [place]

## 2023-12-07 NOTE — Telephone Encounter (Signed)
 Appt made

## 2023-12-09 ENCOUNTER — Ambulatory Visit (INDEPENDENT_AMBULATORY_CARE_PROVIDER_SITE_OTHER): Admitting: Family Medicine

## 2023-12-09 VITALS — BP 134/96 | HR 75 | Temp 97.5°F | Ht 73.0 in | Wt 275.6 lb

## 2023-12-09 DIAGNOSIS — L237 Allergic contact dermatitis due to plants, except food: Secondary | ICD-10-CM | POA: Diagnosis not present

## 2023-12-09 DIAGNOSIS — L03114 Cellulitis of left upper limb: Secondary | ICD-10-CM | POA: Diagnosis not present

## 2023-12-09 MED ORDER — HYDROXYZINE PAMOATE 25 MG PO CAPS
25.0000 mg | ORAL_CAPSULE | Freq: Three times a day (TID) | ORAL | 0 refills | Status: AC | PRN
Start: 2023-12-09 — End: ?

## 2023-12-09 MED ORDER — CEPHALEXIN 500 MG PO CAPS: 500.0000 mg | ORAL_CAPSULE | Freq: Four times a day (QID) | ORAL | 0 refills | Status: AC

## 2023-12-09 MED ORDER — METHYLPREDNISOLONE ACETATE 40 MG/ML IJ SUSP
40.0000 mg | Freq: Once | INTRAMUSCULAR | Status: AC
  Administered 2023-12-09: 60 mg via INTRAMUSCULAR

## 2023-12-09 MED ORDER — FAMOTIDINE 20 MG PO TABS: 20.0000 mg | ORAL_TABLET | Freq: Two times a day (BID) | ORAL | 0 refills | Status: AC

## 2023-12-09 NOTE — Progress Notes (Signed)
 Subjective:  Patient ID: Ryan Conner, male    DOB: 05-13-1980, 43 y.o.   MRN: [id number]  Patient Care Team: Dettinger, Fonda LABOR, MD as PCP - General (Family Medicine)   Chief Complaint:  Poison Ivy (Tele visit 7/27 and states no better.  Bilateral arms and legs )   HPI: Ryan Conner is a 43 y.o. male presenting on 12/09/2023 for Poison Ivy (Tele visit 7/27 and states no better.  Bilateral arms and legs )   Ryan Conner is a 43 year old male who presents with a poison oak rash and associated cellulitis.  He has a poison oak rash that has spread over his body, including his wrist and legs. The rash is intensely pruritic, and the patient reports scratching at it. Despite treatment, the rash and pruritus persist, with temporary relief from the cream lasting about an hour before symptoms return.  He has been using a prescribed cream and oral prednisone , initially taking four pills per day and then tapering. Despite these treatments, significant pruritus continues, disrupting his sleep as he wakes up at 4 AM due to itching and cannot return to sleep.  He has not been taking oral Benadryl but has used Benadryl cream prior to the current prescription. He is currently on prednisone , with a total of 32 or 37 pills prescribed. He is also using a steroid cream for the rash.  He has dogs at home and is concerned that they might be contributing to the persistence of the rash by carrying the plant oils on their fur. No other family members, including his parents, have developed the rash.          Relevant past medical, surgical, family, and social history reviewed and updated as indicated.  Allergies and medications reviewed and updated. Data reviewed: Chart in Epic.   Past Medical History:  Diagnosis Date   Arrhythmia    Deep vein thrombosis (DVT) (HCC)    Hyperlipidemia    Status post placement of implantable loop recorder 2014   Medtronic Reveal LINQ - implanted in [place]      Past Surgical History:  Procedure Laterality Date   CYSTOSCOPY N/A 02/21/2021   Procedure: CYSTOSCOPY;  Surgeon: Sherrilee Belvie CROME, MD;  Location: [facility];  Service: Urology;  Laterality: N/A;   CYSTOSCOPY W/ RETROGRADES Bilateral 10/15/2020   Procedure: CYSTOSCOPY WITH RETROGRADE PYELOGRAM;  Surgeon: Sherrilee Belvie CROME, MD;  Location: [facility];  Service: Urology;  Laterality: Bilateral;   Implantable loop recorder  2014   KNEE SURGERY Right    LOOP RECORDER REMOVAL     TRANSURETHRAL RESECTION OF BLADDER TUMOR N/A 10/15/2020   Procedure: TRANSURETHRAL RESECTION OF BLADDER TUMOR (TURBT);  Surgeon: Sherrilee Belvie CROME, MD;  Location: [facility];  Service: Urology;  Laterality: N/A;   TRANSURETHRAL RESECTION OF BLADDER TUMOR N/A 02/21/2021   Procedure: TRANSURETHRAL RESECTION OF BLADDER TUMOR (TURBT);  Surgeon: Sherrilee Belvie CROME, MD;  Location: [facility];  Service: Urology;  Laterality: N/A;   varicose veins removed   2012   bilateral legs     Social History   Socioeconomic History   Marital status: Married    Spouse name: Not on file   Number of children: 2   Years of education: Not on file   Highest education level: Some college, no degree  Occupational History   Occupation: feild Theme park manager: [employer]  Tobacco Use   Smoking status: Former    Current packs/day: 0.00    Average  packs/day: 0.5 packs/day for 27.4 years (13.7 ttl pk-yrs)    Types: Cigarettes    Start date: 08/04/1992    Quit date: 12/2019    Years since quitting: 3.9   Smokeless tobacco: Never  Vaping Use   Vaping status: Never Used  Substance and Sexual Activity   Alcohol use: Yes    Comment: occasional   Drug use: No   Sexual activity: Yes    Birth control/protection: Surgical  Other Topics Concern   Not on file  Social History Narrative   Lives at home with wife and children   Right handed   Caffeine: coffee, approx 18 oz/day    Social Drivers of Corporate investment banker Strain: Low Risk   (12/09/2023)   Overall Financial Resource Strain (CARDIA)    Difficulty of Paying Living Expenses: Not hard at all  Food Insecurity: No Food Insecurity (12/09/2023)   Hunger Vital Sign    Worried About Running Out of Food in the Last Year: Never true    Ran Out of Food in the Last Year: Never true  Transportation Needs: No Transportation Needs (12/09/2023)   PRAPARE - Administrator, Civil Service (Medical): No    Lack of Transportation (Non-Medical): No  Physical Activity: Insufficiently Active (12/09/2023)   Exercise Vital Sign    Days of Exercise per Week: 5 days    Minutes of Exercise per Session: 20 min  Stress: Stress Concern Present (12/09/2023)   [employer] of Occupational Health - Occupational Stress Questionnaire    Feeling of Stress: To some extent  Social Connections: Moderately Isolated (12/09/2023)   Social Connection and Isolation Panel    Frequency of Communication with Friends and Family: More than three times a week    Frequency of Social Gatherings with Friends and Family: Once a week    Attends Religious Services: Never    Database administrator or Organizations: No    Attends Engineer, structural: Not on file    Marital Status: Married  Intimate Partner Violence: Not on file    Outpatient Encounter Medications as of 12/09/2023  Medication Sig   cephALEXin  (KEFLEX ) 500 MG capsule Take 1 capsule (500 mg total) by mouth 4 (four) times daily for 7 days.   famotidine  (PEPCID ) 20 MG tablet Take 1 tablet (20 mg total) by mouth 2 (two) times daily for 14 days.   hydrOXYzine  (VISTARIL ) 25 MG capsule Take 1 capsule (25 mg total) by mouth every 8 (eight) hours as needed.   metFORMIN  (GLUCOPHAGE ) 1000 MG tablet Take 1 tablet (1,000 mg total) by mouth 2 (two) times daily with a meal.   mupirocin  ointment (BACTROBAN ) 2 % Apply 1 Application topically 2 (two) times daily.   predniSONE  (DELTASONE ) 10 MG tablet Take 4 tablets (40 mg total) by mouth daily  with breakfast for 4 days, THEN 3 tablets (30 mg total) daily with breakfast for 4 days, THEN 2 tablets (20 mg total) daily with breakfast for 3 days, THEN 1 tablet (10 mg total) daily with breakfast for 3 days.   rosuvastatin  (CRESTOR ) 5 MG tablet Take 1 tablet (5 mg total) by mouth at bedtime.   triamcinolone  cream (KENALOG ) 0.1 % Apply 1 Application topically 2 (two) times daily. (Patient not taking: Reported on 12/09/2023)   [EXPIRED] methylPREDNISolone  acetate (DEPO-MEDROL ) injection 40 mg    No facility-administered encounter medications on file as of 12/09/2023.    No Known Allergies  Pertinent ROS per HPI, otherwise unremarkable  Objective:  BP (!) 134/96   Pulse 75   Temp (!) 97.5 F (36.4 C)   Ht 6' 1 (1.854 m)   Wt 275 lb 9.6 oz (125 kg)   SpO2 97%   BMI 36.36 kg/m    Wt Readings from Last 3 Encounters:  12/09/23 275 lb 9.6 oz (125 kg)  09/18/23 284 lb (128.8 kg)  08/17/23 282 lb (127.9 kg)    Physical Exam Vitals and nursing note reviewed.  Constitutional:      General: He is not in acute distress.    Appearance: Normal appearance. He is obese. He is not ill-appearing, toxic-appearing or diaphoretic.  HENT:     Head: Normocephalic and atraumatic.     Nose: Nose normal.     Mouth/Throat:     Mouth: Mucous membranes are moist. No angioedema.  Eyes:     Conjunctiva/sclera: Conjunctivae normal.     Pupils: Pupils are equal, round, and reactive to light.  Cardiovascular:     Rate and Rhythm: Normal rate and regular rhythm.     Heart sounds: Normal heart sounds.  Pulmonary:     Effort: Pulmonary effort is normal.     Breath sounds: Normal breath sounds.  Musculoskeletal:     Cervical back: Neck supple.     Right lower leg: No edema.     Left lower leg: No edema.  Skin:    General: Skin is warm and dry.     Capillary Refill: Capillary refill takes less than 2 seconds.     Findings: Erythema and rash present.         Comments: Rash: significant  erythema to left wrist. Rash erythematous, vesicular, linear and in clusters.   Neurological:     General: No focal deficit present.     Mental Status: He is alert and oriented to person, place, and time.  Psychiatric:        Mood and Affect: Mood normal.        Behavior: Behavior normal.        Thought Content: Thought content normal.        Judgment: Judgment normal.       Results for orders placed or performed in visit on 09/18/23  Bayer DCA Hb A1c Waived   Collection Time: 09/18/23  8:45 AM  Result Value Ref Range   HB A1C (BAYER DCA - WAIVED) 5.4 4.8 - 5.6 %  CBC with Differential/Platelet   Collection Time: 09/18/23  8:48 AM  Result Value Ref Range   WBC 5.4 3.4 - 10.8 x10E3/uL   RBC 5.23 4.14 - 5.80 x10E6/uL   Hemoglobin 14.5 13.0 - 17.7 g/dL   Hematocrit 54.9 62.4 - 51.0 %   MCV 86 79 - 97 fL   MCH 27.7 26.6 - 33.0 pg   MCHC 32.2 31.5 - 35.7 g/dL   RDW 86.8 88.3 - 84.5 %   Platelets 245 150 - 450 x10E3/uL   Neutrophils 46 Not Estab. %   Lymphs 40 Not Estab. %   Monocytes 11 Not Estab. %   Eos 2 Not Estab. %   Basos 1 Not Estab. %   Neutrophils Absolute 2.5 1.4 - 7.0 x10E3/uL   Lymphocytes Absolute 2.2 0.7 - 3.1 x10E3/uL   Monocytes Absolute 0.6 0.1 - 0.9 x10E3/uL   EOS (ABSOLUTE) 0.1 0.0 - 0.4 x10E3/uL   Basophils Absolute 0.0 0.0 - 0.2 x10E3/uL   Immature Granulocytes 0 Not Estab. %   Immature Grans (Abs) 0.0 0.0 -  0.1 x10E3/uL  CMP14+EGFR   Collection Time: 09/18/23  8:48 AM  Result Value Ref Range   Glucose 98 70 - 99 mg/dL   BUN 23 6 - 24 mg/dL   Creatinine, Ser 9.15 0.76 - 1.27 mg/dL   eGFR 887 >40 fO/fpw/8.26   BUN/Creatinine Ratio 27 (H) 9 - 20   Sodium 138 134 - 144 mmol/L   Potassium 4.7 3.5 - 5.2 mmol/L   Chloride 102 96 - 106 mmol/L   CO2 20 20 - 29 mmol/L   Calcium  9.0 8.7 - 10.2 mg/dL   Total Protein 6.7 6.0 - 8.5 g/dL   Albumin 4.4 4.1 - 5.1 g/dL   Globulin, Total 2.3 1.5 - 4.5 g/dL   Bilirubin Total 0.2 0.0 - 1.2 mg/dL   Alkaline  Phosphatase 104 44 - 121 IU/L   AST 17 0 - 40 IU/L   ALT 31 0 - 44 IU/L  Lipid panel   Collection Time: 09/18/23  8:48 AM  Result Value Ref Range   Cholesterol, Total 173 100 - 199 mg/dL   Triglycerides 779 (H) 0 - 149 mg/dL   HDL 44 >60 mg/dL   VLDL Cholesterol Cal 37 5 - 40 mg/dL   LDL Chol Calc (NIH) 92 0 - 99 mg/dL   Chol/HDL Ratio 3.9 0.0 - 5.0 ratio       Pertinent labs & imaging results that were available during my care of the patient were reviewed by me and considered in my medical decision making.  Assessment & Plan:  Ryan Conner was seen today for poison ivy.  Diagnoses and all orders for this visit:  Poison ivy dermatitis -     hydrOXYzine  (VISTARIL ) 25 MG capsule; Take 1 capsule (25 mg total) by mouth every 8 (eight) hours as needed. -     famotidine  (PEPCID ) 20 MG tablet; Take 1 tablet (20 mg total) by mouth 2 (two) times daily for 14 days. -     methylPREDNISolone  acetate (DEPO-MEDROL ) injection 40 mg  Cellulitis of left upper extremity -     cephALEXin  (KEFLEX ) 500 MG capsule; Take 1 capsule (500 mg total) by mouth 4 (four) times daily for 7 days.     Poison oak dermatitis with secondary cellulitis Acute poison oak dermatitis with secondary cellulitis due to scratching. Dermatitis is widespread, including the wrist and legs, causing significant itching. The cellulitis is localized to areas of intense scratching. Current treatment includes oral prednisone , but itching persists. - Administer steroid injection to boost healing. - Prescribe Keflex  four times a day for seven days for cellulitis. - Continue oral prednisone  as prescribed. - Prescribe Atarax  for itching, to be taken every eight hours as needed, with caution due to sedative effects. - Advise use of Benadryl cream in combination with steroid cream for topical relief. - Recommend over-the-counter Pepcid  (famotidine ) twice a day for fourteen days to help with itching. - Instruct to wash dogs to remove  plant oils and prevent re-exposure. - [facility]. - Advise to report any worsening symptoms or new developments.          Continue all other maintenance medications.  Follow up plan: Return if symptoms worsen or fail to improve.   Continue healthy lifestyle choices, including diet (rich in fruits, vegetables, and lean proteins, and low in salt and simple carbohydrates) and exercise (at least 30 minutes of moderate physical activity daily).  Educational handout given for poison ivy dermatitis   The above assessment and management plan  was discussed with the patient. The patient verbalized understanding of and has agreed to the management plan. Patient is aware to call the clinic if they develop any new symptoms or if symptoms persist or worsen. Patient is aware when to return to the clinic for a follow-up visit. Patient educated on when it is appropriate to go to the emergency department.   Rosaline Bruns, FNP-C [facility] [phone]

## 2024-01-07 ENCOUNTER — Other Ambulatory Visit: Payer: Self-pay | Admitting: Urology

## 2024-01-07 NOTE — Telephone Encounter (Signed)
 Needs Dr Sherrilee to send in Valium  for Cysto send to [facility]

## 2024-01-08 MED ORDER — DIAZEPAM 10 MG PO TABS: 10.0000 mg | ORAL_TABLET | Freq: Once | ORAL | 0 refills | Status: AC

## 2024-01-13 ENCOUNTER — Encounter: Payer: Self-pay | Admitting: Urology

## 2024-01-13 ENCOUNTER — Ambulatory Visit (INDEPENDENT_AMBULATORY_CARE_PROVIDER_SITE_OTHER): Payer: BC Managed Care – PPO | Admitting: Urology

## 2024-01-13 VITALS — BP 125/84 | HR 83

## 2024-01-13 DIAGNOSIS — D494 Neoplasm of unspecified behavior of bladder: Secondary | ICD-10-CM

## 2024-01-13 DIAGNOSIS — C672 Malignant neoplasm of lateral wall of bladder: Secondary | ICD-10-CM

## 2024-01-13 MED ORDER — CIPROFLOXACIN HCL 500 MG PO TABS
500.0000 mg | ORAL_TABLET | Freq: Once | ORAL | Status: AC
  Administered 2024-01-13: 500 mg via ORAL

## 2024-01-13 NOTE — Patient Instructions (Signed)
 Transurethral Resection of Bladder Tumor  Transurethral resection of a bladder tumor is the removal (resection) of cancerous tissue (tumor) from the inside wall of the bladder. The bladder is the organ that holds urine. The tumor is removed through the tube that carries urine out of the body (urethra). In a transurethral resection, a thin telescope with a light, a tiny camera, and an electric cutting edge (resectoscope) is passed through the urethra. In men, the opening of the urethra is at the end of the penis. In women, it is just above the opening of the vagina. Tell a health care provider about: Any allergies you have. All medicines you are taking, including vitamins, herbs, eye drops, creams, and over-the-counter medicines. Any problems you or family members have had with anesthetic medicines. Any bleeding problems you have. Any surgeries you have had. Any medical conditions you have, including recent urinary tract infections. Whether you are pregnant or may be pregnant. What are the risks? Generally, this is a safe procedure. However, problems may occur, including: Infection. Bleeding. Allergic reactions to medicines. Damage to nearby structures or organs. Difficulty urinating from blockage of the urethra or not being able to urinate (urinary retention). Deep vein thrombosis. This is a blood clot that can develop in your leg. Recurring cancer. What happens before the procedure? When to stop eating and drinking Follow instructions from your health care provider about what you may eat and drink before your procedure. These may include: 8 hours before your procedure Stop eating most foods. Do not eat meat, fried foods, or fatty foods. Eat only light foods, such as toast or crackers. All liquids are okay except energy drinks and alcohol. 6 hours before your procedure Stop eating. Drink only clear liquids, such as water, clear fruit juice, black coffee, plain tea, and sports  drinks. Do not drink energy drinks or alcohol. 2 hours before your procedure Stop drinking all liquids. You may be allowed to take medicines with small sips of water. Medicines Ask your health care provider about: Changing or stopping your regular medicines. This is especially important if you are taking diabetes medicines or blood thinners. Taking medicines such as aspirin and ibuprofen. These medicines can thin your blood. Do not take these medicines unless your health care provider tells you to take them. Taking over-the-counter medicines, vitamins, herbs, and supplements. General instructions If you will be going home right after the procedure, plan to have a responsible adult: Take you home from the hospital or clinic. You will not be allowed to drive. Care for you for the time you are told. Ask your health care provider what steps will be taken to help prevent infection. These steps may include: Washing skin with a germ-killing soap. Taking antibiotic medicine. Do not use any products that contain nicotine or tobacco for at least 4 weeks before the procedure. These products include cigarettes, chewing tobacco, and vaping devices, such as e-cigarettes. If you need help quitting, ask your health care provider. What happens during the procedure? An IV will be inserted into one of your veins. You will be given one or more of the following: A medicine to help you relax (sedative). A medicine that is injected into your spine to numb the area below and slightly above the injection site (spinal anesthetic). A medicine that is injected into an area of your body to numb everything below the injection site (regional anesthetic). A medicine to make you fall asleep (general anesthetic). Your legs will be placed in foot rests (  stirrups) to open your legs and bend your knees. The resectoscope will be passed through your urethra and into your bladder. The part of your bladder with the tumor will be  resected by the cutting edge of the resectoscope. Fluid will be passed to rinse out the cut tissues (irrigation). The resectoscope will then be taken out. A small, thin tube (catheter) will be passed through your urethra and into your bladder. The catheter will drain urine into a bag outside of your body. The procedure may vary among health care providers and hospitals. What happens after the procedure? Your blood pressure, heart rate, breathing rate, and blood oxygen level will be monitored until you leave the hospital or clinic. You may continue to receive fluids and medicines through an IV. You will be given pain medicine to relieve pain. You will have a catheter to drain your urine. The amount of urine will be measured. If you have blood in your urine, your bladder may be rinsed out by passing fluid through your catheter. You will be encouraged to walk as soon as you can. You may have to wear compression stockings. These stockings help to prevent blood clots and reduce swelling in your legs. If you were given a sedative during the procedure, it can affect you for several hours. Do not drive or operate machinery until your health care provider says that it is safe. Summary Transurethral resection of a bladder tumor is the removal (resection) of a cancerous growth (tumor) on the inside wall of the bladder. To do this procedure, your health care provider uses a thin telescope with a light, a tiny camera, and an electric cutting edge (resectoscope) that is guided to your bladder through your urethra. The part of your bladder that is affected by the tumor will be resected by the cutting edge of the resectoscope. A catheter will be passed through your urethra and into your bladder. The catheter will drain urine into a bag outside of your body. If you will be going home right after the procedure, plan to have a responsible adult take you home from the hospital or clinic. You will not be allowed to  drive. This information is not intended to replace advice given to you by your health care provider. Make sure you discuss any questions you have with your health care provider. Document Revised: 05/03/2021 Document Reviewed: 05/03/2021 Elsevier Patient Education  2024 [employer].

## 2024-01-13 NOTE — Progress Notes (Signed)
   01/13/24  CC: followup bladder tumor   HPI: Ryan Conner is a 43yo here for cystoscopy for a bladder tumor Blood pressure 125/84, pulse 83. NED. A&Ox3.   No respiratory distress   Abd soft, NT, ND Normal phallus with bilateral descended testicles  Cystoscopy Procedure Note  Patient identification was confirmed, informed consent was obtained, and patient was prepped using Betadine solution.  Lidocaine  jelly was administered per urethral meatus.     Pre-Procedure: - Inspection reveals a normal caliber ureteral meatus.  Procedure: The flexible cystoscope was introduced without difficulty - No urethral strictures/lesions are present. - Normal prostate  - Normal bladder neck - Bilateral ureteral orifices identified - Bladder mucosa  reveals no ulcers, tumors, or lesions - No bladder stones - No trabeculation     Post-Procedure: - Patient tolerated the procedure well  Assessment/ Plan: Folowup 1 year for cystoscopy  No follow-ups on file.  Belvie Clara, MD

## 2024-03-10 DIAGNOSIS — R0602 Shortness of breath: Secondary | ICD-10-CM | POA: Diagnosis not present

## 2024-03-10 DIAGNOSIS — Z79899 Other long term (current) drug therapy: Secondary | ICD-10-CM | POA: Diagnosis not present

## 2024-03-10 DIAGNOSIS — R0789 Other chest pain: Secondary | ICD-10-CM | POA: Diagnosis not present

## 2024-03-10 DIAGNOSIS — R7989 Other specified abnormal findings of blood chemistry: Secondary | ICD-10-CM | POA: Diagnosis not present

## 2024-03-10 DIAGNOSIS — R079 Chest pain, unspecified: Secondary | ICD-10-CM | POA: Diagnosis not present

## 2024-03-11 ENCOUNTER — Telehealth: Payer: Self-pay

## 2024-03-11 ENCOUNTER — Encounter: Payer: Self-pay | Admitting: Family Medicine

## 2024-03-11 ENCOUNTER — Ambulatory Visit (INDEPENDENT_AMBULATORY_CARE_PROVIDER_SITE_OTHER): Admitting: Family Medicine

## 2024-03-11 VITALS — BP 129/92 | HR 61 | Temp 98.1°F | Ht 73.0 in | Wt 271.0 lb

## 2024-03-11 DIAGNOSIS — I8393 Asymptomatic varicose veins of bilateral lower extremities: Secondary | ICD-10-CM

## 2024-03-11 DIAGNOSIS — Z9889 Other specified postprocedural states: Secondary | ICD-10-CM | POA: Diagnosis not present

## 2024-03-11 DIAGNOSIS — I471 Supraventricular tachycardia, unspecified: Secondary | ICD-10-CM

## 2024-03-11 DIAGNOSIS — R079 Chest pain, unspecified: Secondary | ICD-10-CM
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
## 2024-03-11 NOTE — Telephone Encounter (Signed)
 Copied from CRM #[id number]. Topic: General - Other >> Mar 11, 2024 12:15 PM Leonette P wrote: Reason for CRM: Patient was in this morning and would like a note sent to his my chart for his employer to get releasing him back to work.

## 2024-04-25 ENCOUNTER — Ambulatory Visit: Admitting: Family

## 2024-04-25 VITALS — BP 117/85 | HR 94 | Temp 97.6°F | Ht 73.0 in | Wt 269.4 lb

## 2024-04-25 DIAGNOSIS — R6889 Other general symptoms and signs: Secondary | ICD-10-CM

## 2024-04-25 DIAGNOSIS — J209 Acute bronchitis, unspecified: Secondary | ICD-10-CM | POA: Diagnosis not present

## 2024-04-25 LAB — VERITOR SARS-COV-2 AND FLU A+B
BD Veritor SARS-CoV-2 Ag: NEGATIVE
Influenza A: NEGATIVE
Influenza B: NEGATIVE

## 2024-04-25 MED ORDER — PREDNISONE 10 MG (21) PO TBPK: ORAL_TABLET | ORAL | 0 refills | Status: AC

## 2024-04-25 MED ORDER — BENZONATATE 200 MG PO CAPS: 200.0000 mg | ORAL_CAPSULE | Freq: Two times a day (BID) | ORAL | 0 refills | Status: AC | PRN

## 2024-04-25 NOTE — Progress Notes (Signed)
 Subjective:    Patient ID: Ryan Conner, male    DOB: 02-16-1981, 43 y.o.   MRN: [id number]  Chief Complaint  Patient presents with   Cough    5 days    Chills   Generalized Body Aches   PT presents to the office today with cough congestion that started 6 days ago.  Cough This is a new problem. The current episode started in the past 7 days. The problem has been unchanged. The problem occurs every few minutes. The cough is Non-productive. Associated symptoms include chills, headaches, myalgias, nasal congestion, postnasal drip, rhinorrhea and shortness of breath. Pertinent negatives include no ear congestion, ear pain, fever, sore throat or wheezing. He has tried rest and OTC cough suppressant for the symptoms. The treatment provided mild relief.      Review of Systems  Constitutional:  Positive for chills. Negative for fever.  HENT:  Positive for postnasal drip and rhinorrhea. Negative for ear pain and sore throat.   Respiratory:  Positive for cough and shortness of breath. Negative for wheezing.   Musculoskeletal:  Positive for myalgias.  Neurological:  Positive for headaches.  All other systems reviewed and are negative.   Social History   Socioeconomic History   Marital status: Married    Spouse name: Not on file   Number of children: 2   Years of education: Not on file   Highest education level: Associate degree: occupational, scientist, product/process development, or vocational program  Occupational History   Occupation: [employer] Manager: [employer]  Tobacco Use   Smoking status: Former    Current packs/day: 0.00    Average packs/day: 0.5 packs/day for 27.4 years (13.7 ttl pk-yrs)    Types: Cigarettes    Start date: 08/04/1992    Quit date: 12/2019    Years since quitting: 4.3   Smokeless tobacco: Never  Vaping Use   Vaping status: Never Used  Substance and Sexual Activity   Alcohol use: Yes    Comment: occasional   Drug use: No   Sexual activity: Yes    Birth  control/protection: Surgical  Other Topics Concern   Not on file  Social History Narrative   Lives at home with wife and children   Right handed   Caffeine: coffee, approx 18 oz/day    Social Drivers of Health   Tobacco Use: Medium Risk (04/25/2024)   Patient History    Smoking Tobacco Use: Former    Smokeless Tobacco Use: Never    Passive Exposure: Not on Actuary Strain: Low Risk (04/25/2024)   Overall Financial Resource Strain (CARDIA)    Difficulty of Paying Living Expenses: Not hard at all  Food Insecurity: No Food Insecurity (04/25/2024)   Epic    Worried About Radiation Protection Practitioner of Food in the Last Year: Never true    Ran Out of Food in the Last Year: Never true  Transportation Needs: No Transportation Needs (04/25/2024)   Epic    Lack of Transportation (Medical): No    Lack of Transportation (Non-Medical): No  Physical Activity: Sufficiently Active (04/25/2024)   Exercise Vital Sign    Days of Exercise per Week: 5 days    Minutes of Exercise per Session: 30 min  Stress: No Stress Concern Present (04/25/2024)   [employer] of Occupational Health - Occupational Stress Questionnaire    Feeling of Stress: Only a little  Social Connections: Moderately Isolated (04/25/2024)   Social Connection and Isolation Panel    Frequency of  Communication with Friends and Family: More than three times a week    Frequency of Social Gatherings with Friends and Family: Once a week    Attends Religious Services: Never    Database Administrator or Organizations: No    Attends Engineer, Structural: Not on file    Marital Status: Married  Depression (PHQ2-9): Low Risk (03/11/2024)   Depression (PHQ2-9)    PHQ-2 Score: 0  Alcohol Screen: Low Risk (12/09/2023)   Alcohol Screen    Last Alcohol Screening Score (AUDIT): 1  Housing: Unknown (04/25/2024)   Epic    Unable to Pay for Housing in the Last Year: No    Number of Times Moved in the Last Year: Not on file     Homeless in the Last Year: No  Utilities: Not on file  Health Literacy: Not on file   Family History  Problem Relation Age of Onset   Hypertension Mother    Varicose Veins Mother    Meniere's disease Mother    Migraines Mother        used to get them alot more, also had vertigo    Alcoholism Paternal Grandfather         Objective:   Physical Exam Vitals reviewed.  Constitutional:      General: He is not in acute distress.    Appearance: He is well-developed.  HENT:     Head: Normocephalic.     Right Ear: Tympanic membrane normal.     Left Ear: Tympanic membrane normal.  Eyes:     General:        Right eye: No discharge.        Left eye: No discharge.     Pupils: Pupils are equal, round, and reactive to light.  Neck:     Thyroid : No thyromegaly.  Cardiovascular:     Rate and Rhythm: Normal rate and regular rhythm.     Heart sounds: Normal heart sounds. No murmur heard. Pulmonary:     Effort: Pulmonary effort is normal. No respiratory distress.     Breath sounds: Normal breath sounds. No wheezing.     Comments: Dry nonproductive cough Abdominal:     General: Bowel sounds are normal. There is no distension.     Palpations: Abdomen is soft.     Tenderness: There is no abdominal tenderness.  Musculoskeletal:        General: No tenderness. Normal range of motion.     Cervical back: Normal range of motion and neck supple.  Skin:    General: Skin is warm and dry.     Findings: No erythema or rash.  Neurological:     Mental Status: He is alert and oriented to person, place, and time.     Cranial Nerves: No cranial nerve deficit.     Deep Tendon Reflexes: Reflexes are normal and symmetric.  Psychiatric:        Behavior: Behavior normal.        Thought Content: Thought content normal.        Judgment: Judgment normal.       BP 117/85   Pulse 94   Temp 97.6 F (36.4 C) (Temporal)   Ht 6' 1 (1.854 m)   Wt 269 lb 6.4 oz (122.2 kg)   BMI 35.54 kg/m       Assessment & Plan:  Daquarius Barre comes in today with chief complaint of Cough (5 days ), Chills, and Generalized Body Aches   Diagnosis  and orders addressed:  1. Flu-like symptoms (Primary) - Veritor SARS-CoV-2 and Flu A+B  2. Acute bronchitis, unspecified organism - Take meds as prescribed - Use a cool mist humidifier  -Use saline nose sprays frequently -Force fluids -For any cough or congestion  Use plain Mucinex- regular strength or max strength is fine -For fever or aces or pains- take tylenol  or ibuprofen. -Throat lozenges if help -Follow up if symptoms worsen or do not improve  - predniSONE  (STERAPRED UNI-PAK 21 TAB) 10 MG (21) TBPK tablet; Use as directed  Dispense: 21 tablet; Refill: 0 - benzonatate  (TESSALON ) 200 MG capsule; Take 1 capsule (200 mg total) by mouth 2 (two) times daily as needed for cough.  Dispense: 20 capsule; Refill: 0     Bari Learn, FNP

## 2024-04-25 NOTE — Patient Instructions (Signed)

## 2024-04-26 ENCOUNTER — Ambulatory Visit: Payer: Self-pay | Admitting: Family

## 2024-05-30 ENCOUNTER — Ambulatory Visit: Admitting: Cardiology

## 2024-06-02 ENCOUNTER — Other Ambulatory Visit: Payer: Self-pay | Admitting: Vascular Surgery

## 2024-06-02 DIAGNOSIS — M7989 Other specified soft tissue disorders: Secondary | ICD-10-CM

## 2024-06-27 ENCOUNTER — Ambulatory Visit (HOSPITAL_COMMUNITY)

## 2024-07-01 ENCOUNTER — Ambulatory Visit: Admitting: Internal Medicine

## 2025-01-18 ENCOUNTER — Other Ambulatory Visit: Admitting: Urology
